# Patient Record
Sex: Female | Born: 1980 | Race: Black or African American | Hispanic: No | Marital: Single | Smoking: Never smoker
Health system: Southern US, Community
[De-identification: ages and names within clinical notes are randomized; demographics above are authoritative.]

## PROBLEM LIST (undated history)

## (undated) HISTORY — PX: LAPAROSCOPIC OVARIAN CYSTECTOMY: SUR786

## (undated) HISTORY — PX: KNEE SURGERY: SHX244

---

## 2002-07-07 ENCOUNTER — Inpatient Hospital Stay (HOSPITAL_COMMUNITY): Admission: AD | Admit: 2002-07-07 | Discharge: 2002-07-07 | Payer: Self-pay | Admitting: Obstetrics and Gynecology

## 2002-07-07 ENCOUNTER — Encounter: Payer: Self-pay | Admitting: Obstetrics and Gynecology

## 2002-09-21 ENCOUNTER — Inpatient Hospital Stay (HOSPITAL_COMMUNITY): Admission: AD | Admit: 2002-09-21 | Discharge: 2002-09-21 | Payer: Self-pay | Admitting: *Deleted

## 2002-09-21 ENCOUNTER — Encounter: Payer: Self-pay | Admitting: *Deleted

## 2002-09-24 ENCOUNTER — Encounter (HOSPITAL_COMMUNITY): Admission: RE | Admit: 2002-09-24 | Discharge: 2002-10-24 | Payer: Self-pay | Admitting: *Deleted

## 2002-09-28 ENCOUNTER — Encounter: Payer: Self-pay | Admitting: *Deleted

## 2002-10-03 ENCOUNTER — Inpatient Hospital Stay (HOSPITAL_COMMUNITY): Admission: AD | Admit: 2002-10-03 | Discharge: 2002-10-03 | Payer: Self-pay | Admitting: *Deleted

## 2003-10-30 ENCOUNTER — Emergency Department (HOSPITAL_COMMUNITY): Admission: EM | Admit: 2003-10-30 | Discharge: 2003-10-30 | Payer: Self-pay | Admitting: Emergency Medicine

## 2004-01-24 ENCOUNTER — Encounter (INDEPENDENT_AMBULATORY_CARE_PROVIDER_SITE_OTHER): Payer: Self-pay | Admitting: Specialist

## 2004-01-24 ENCOUNTER — Other Ambulatory Visit: Admission: RE | Admit: 2004-01-24 | Discharge: 2004-01-24 | Payer: Self-pay | Admitting: Obstetrics & Gynecology

## 2004-01-24 ENCOUNTER — Encounter: Admission: RE | Admit: 2004-01-24 | Discharge: 2004-01-24 | Payer: Self-pay | Admitting: Obstetrics and Gynecology

## 2004-03-13 ENCOUNTER — Other Ambulatory Visit: Admission: RE | Admit: 2004-03-13 | Discharge: 2004-03-13 | Payer: Self-pay | Admitting: Obstetrics & Gynecology

## 2004-03-13 ENCOUNTER — Encounter (INDEPENDENT_AMBULATORY_CARE_PROVIDER_SITE_OTHER): Payer: Self-pay | Admitting: *Deleted

## 2004-03-13 ENCOUNTER — Encounter: Admission: RE | Admit: 2004-03-13 | Discharge: 2004-03-13 | Payer: Self-pay | Admitting: Obstetrics and Gynecology

## 2004-03-19 ENCOUNTER — Emergency Department (HOSPITAL_COMMUNITY): Admission: EM | Admit: 2004-03-19 | Discharge: 2004-03-19 | Payer: Self-pay | Admitting: Emergency Medicine

## 2004-03-23 ENCOUNTER — Inpatient Hospital Stay (HOSPITAL_COMMUNITY): Admission: AD | Admit: 2004-03-23 | Discharge: 2004-03-23 | Payer: Self-pay | Admitting: Obstetrics and Gynecology

## 2004-03-24 ENCOUNTER — Inpatient Hospital Stay (HOSPITAL_COMMUNITY): Admission: AD | Admit: 2004-03-24 | Discharge: 2004-03-24 | Payer: Self-pay | Admitting: Obstetrics and Gynecology

## 2004-09-11 ENCOUNTER — Ambulatory Visit: Payer: Self-pay | Admitting: Obstetrics & Gynecology

## 2005-04-17 ENCOUNTER — Emergency Department (HOSPITAL_COMMUNITY): Admission: EM | Admit: 2005-04-17 | Discharge: 2005-04-17 | Payer: Self-pay | Admitting: Emergency Medicine

## 2006-11-04 ENCOUNTER — Emergency Department (HOSPITAL_COMMUNITY): Admission: EM | Admit: 2006-11-04 | Discharge: 2006-11-04 | Payer: Self-pay | Admitting: Emergency Medicine

## 2008-07-09 ENCOUNTER — Emergency Department (HOSPITAL_COMMUNITY): Admission: EM | Admit: 2008-07-09 | Discharge: 2008-07-09 | Payer: Self-pay | Admitting: Emergency Medicine

## 2009-01-09 ENCOUNTER — Emergency Department (HOSPITAL_COMMUNITY): Admission: EM | Admit: 2009-01-09 | Discharge: 2009-01-09 | Payer: Self-pay | Admitting: Emergency Medicine

## 2009-02-17 ENCOUNTER — Emergency Department (HOSPITAL_COMMUNITY): Admission: EM | Admit: 2009-02-17 | Discharge: 2009-02-17 | Payer: Self-pay | Admitting: Emergency Medicine

## 2009-09-03 ENCOUNTER — Emergency Department (HOSPITAL_COMMUNITY): Admission: EM | Admit: 2009-09-03 | Discharge: 2009-09-03 | Payer: Self-pay | Admitting: Emergency Medicine

## 2010-05-07 ENCOUNTER — Emergency Department (HOSPITAL_COMMUNITY): Admission: EM | Admit: 2010-05-07 | Discharge: 2010-05-08 | Payer: Self-pay | Admitting: Emergency Medicine

## 2010-12-21 LAB — URINALYSIS, ROUTINE W REFLEX MICROSCOPIC
Bilirubin Urine: NEGATIVE
Glucose, UA: NEGATIVE mg/dL
Hgb urine dipstick: NEGATIVE
Ketones, ur: NEGATIVE mg/dL
Nitrite: NEGATIVE
Protein, ur: NEGATIVE mg/dL
Specific Gravity, Urine: 1.026 (ref 1.005–1.030)
Urobilinogen, UA: 0.2 mg/dL (ref 0.0–1.0)
pH: 6.5 (ref 5.0–8.0)

## 2010-12-21 LAB — POCT PREGNANCY, URINE: Preg Test, Ur: NEGATIVE

## 2011-01-15 LAB — DIFFERENTIAL
Eosinophils Absolute: 0 10*3/uL (ref 0.0–0.7)
Lymphs Abs: 1 10*3/uL (ref 0.7–4.0)
Monocytes Relative: 10 % (ref 3–12)
Neutro Abs: 2 10*3/uL (ref 1.7–7.7)
Neutrophils Relative %: 59 % (ref 43–77)

## 2011-01-15 LAB — URINALYSIS, ROUTINE W REFLEX MICROSCOPIC
Bilirubin Urine: NEGATIVE
Ketones, ur: NEGATIVE mg/dL
Nitrite: NEGATIVE
Specific Gravity, Urine: 1.016 (ref 1.005–1.030)
Urobilinogen, UA: 1 mg/dL (ref 0.0–1.0)
pH: 7 (ref 5.0–8.0)

## 2011-01-15 LAB — COMPREHENSIVE METABOLIC PANEL
ALT: 17 U/L (ref 0–35)
BUN: 7 mg/dL (ref 6–23)
CO2: 26 mEq/L (ref 19–32)
Calcium: 9.4 mg/dL (ref 8.4–10.5)
Creatinine, Ser: 0.93 mg/dL (ref 0.4–1.2)
GFR calc non Af Amer: >60 mL/min (ref >60)
Glucose, Bld: 92 mg/dL (ref 70–99)
Total Protein: 7.4 g/dL (ref 6.0–8.3)

## 2011-01-15 LAB — PREGNANCY, URINE: Preg Test, Ur: NEGATIVE

## 2011-01-15 LAB — CBC
HCT: 36.8 % (ref 36.0–46.0)
Hemoglobin: 12.6 g/dL (ref 12.0–15.0)
MCHC: 34.1 g/dL (ref 30.0–36.0)
MCV: 94.3 fL (ref 78.0–100.0)
RBC: 3.91 MIL/uL (ref 3.87–5.11)
RDW: 13.7 % (ref 11.5–15.5)

## 2011-01-15 LAB — LIPASE, BLOOD: Lipase: 16 U/L (ref 11–59)

## 2012-06-14 ENCOUNTER — Encounter (HOSPITAL_COMMUNITY): Payer: Self-pay | Admitting: Emergency Medicine

## 2012-06-14 ENCOUNTER — Emergency Department (HOSPITAL_COMMUNITY)
Admission: EM | Admit: 2012-06-14 | Discharge: 2012-06-14 | Disposition: A | Discharge status: Home / Self Care | Payer: Self-pay | Attending: Emergency Medicine | Admitting: Emergency Medicine

## 2012-06-14 ENCOUNTER — Emergency Department (HOSPITAL_COMMUNITY): Payer: Self-pay

## 2012-06-14 DIAGNOSIS — B349 Viral infection, unspecified: Secondary | ICD-10-CM

## 2012-06-14 DIAGNOSIS — B9789 Other viral agents as the cause of diseases classified elsewhere: Secondary | ICD-10-CM | POA: Insufficient documentation

## 2012-06-14 DIAGNOSIS — R0982 Postnasal drip: Secondary | ICD-10-CM

## 2012-06-14 MED ORDER — OXYMETAZOLINE HCL 0.05 % NA SOLN: 2.0000 | Freq: Two times a day (BID) | NASAL | Status: AC

## 2012-06-14 MED ORDER — PSEUDOEPHEDRINE HCL 30 MG/5ML PO SYRP: 30.0000 mg | ORAL_SOLUTION | Freq: Four times a day (QID) | ORAL | Status: AC | PRN

## 2012-06-14 NOTE — ED Notes (Signed)
States that she started having a bad cough last pm accompanied by throat pain and right ear pain. States that she has a productive cough of greenish brown mucus. States that she also has head congestion but is unable to clear her sinuses.

## 2012-06-14 NOTE — ED Notes (Signed)
Discharge instructions reviewed w/ pt., verbalizes understanding. Two prescriptions provided at time of discharge. 

## 2012-06-14 NOTE — ED Provider Notes (Signed)
History     CSN: [id number]  Arrival date & time 06/14/12  0910   First MD Initiated Contact with Patient 06/14/12 [phone]      Chief Complaint  Patient presents with  . Otalgia  . Sore Throat  . Pleurisy    (Consider location/radiation/quality/duration/timing/severity/associated sxs/prior treatment) The history is provided by the patient.  Rebecca Mccarthy is a 31 y.o. female here with sinus congestion, cough, and throat pain, ear pain, and chest pain. Started with sinus congestion x 1 week. She notes that she has been having significant post nasal drip and now is coughing up greenish phlegm. No fevers at home. She also feels that her throat and ears hurt as well. She also felt tightness in her chest when she coughs. She has been using dayquil and that hasn't helped. She is not a smoker and has no family hx of CAD.   History reviewed. No pertinent past medical history.  Past Surgical History  Procedure Date  . Cesarean section   . Knee surgery   . Laparoscopic ovarian cystectomy     No family history on file.  History  Substance Use Topics  . Smoking status: Never Smoker   . Smokeless tobacco: Not on file  . Alcohol Use: No    OB History    Grav Para Term Preterm Abortions TAB SAB Ect Mult Living                  Review of Systems  HENT: Positive for ear pain, congestion, sore throat, rhinorrhea, postnasal drip and sinus pressure.   Cardiovascular: Positive for chest pain.  All other systems reviewed and are negative.    Allergies  Shrimp  Home Medications   Current Outpatient Rx  Name Route Sig Dispense Refill  . DAYQUIL MULTI-SYMPTOM COLD/FLU PO Oral Take 1 capsule by mouth daily as needed. For cold      BP 118/68  Pulse 66  Temp 98.1 F (36.7 C) (Oral)  Resp 21  Wt 235 lb (106.595 kg)  SpO2 95%  Physical Exam  Nursing note and vitals reviewed. Constitutional: She is oriented to person, place, and time. She appears well-developed and  well-nourished.       NAD  HENT:  Head: Normocephalic.  Mouth/Throat: Oropharynx is clear and moist.       Bilateral TM no effusion. No sinus tenderness. OP clear.   Eyes: Conjunctivae are normal. Pupils are equal, round, and reactive to light.  Neck: Normal range of motion. Neck supple.  Cardiovascular: Normal rate, regular rhythm and normal heart sounds.   Pulmonary/Chest: Effort normal and breath sounds normal. No respiratory distress. She has no wheezes.  Abdominal: Soft. Bowel sounds are normal.  Musculoskeletal: Normal range of motion. She exhibits no edema and no tenderness.  Neurological: She is alert and oriented to person, place, and time.  Skin: Skin is warm and dry.  Psychiatric: She has a normal mood and affect. Her behavior is normal. Judgment and thought content normal.    ED Course  Procedures (including critical care time)  Labs Reviewed - No data to display Dg Chest 2 View  06/14/2012  *RADIOLOGY REPORT*  Clinical Data: Sore throat and drainage 6 days with cough.  CHEST - 2 VIEW  Comparison: None.  Findings: Normal heart, mediastinal, and hilar contours.  Lungs are normally expanded and clear.  The trachea is midline.  The bony structures and upper abdomen are unremarkable.  IMPRESSION: No acute cardiopulmonary disease.   Original  Report Authenticated By: Britta Mccreedy, M.D.      No diagnosis found.   Date: 06/14/2012  Rate: 66  Rhythm: normal sinus rhythm  QRS Axis: normal  Intervals: normal  ST/T Wave abnormalities: normal  Conduction Disutrbances:none  Narrative Interpretation:   Old EKG Reviewed: none available    MDM  [name] is a 31 y.o. female here with viral syndrome. Her symptoms are likely from post nasal drip. I counseled her to continue pseudoephed, use afrin for congestion x 3 days, and take motrin for pain. Her EKG is nl and I have low suspicion for acs on her.          Richardean Canal, MD 06/14/12 1041

## 2013-03-30 ENCOUNTER — Emergency Department (HOSPITAL_COMMUNITY): Payer: PRIVATE HEALTH INSURANCE

## 2013-03-30 ENCOUNTER — Emergency Department (HOSPITAL_COMMUNITY)
Admission: EM | Admit: 2013-03-30 | Discharge: 2013-03-30 | Disposition: A | Discharge status: Home / Self Care | Payer: PRIVATE HEALTH INSURANCE | Attending: Emergency Medicine | Admitting: Emergency Medicine

## 2013-03-30 ENCOUNTER — Encounter (HOSPITAL_COMMUNITY): Payer: Self-pay

## 2013-03-30 DIAGNOSIS — S46819A Strain of other muscles, fascia and tendons at shoulder and upper arm level, unspecified arm, initial encounter: Secondary | ICD-10-CM | POA: Insufficient documentation

## 2013-03-30 DIAGNOSIS — S4991XA Unspecified injury of right shoulder and upper arm, initial encounter: Secondary | ICD-10-CM

## 2013-03-30 DIAGNOSIS — Z79899 Other long term (current) drug therapy: Secondary | ICD-10-CM | POA: Insufficient documentation

## 2013-03-30 DIAGNOSIS — M62838 Other muscle spasm: Secondary | ICD-10-CM

## 2013-03-30 DIAGNOSIS — S46811A Strain of other muscles, fascia and tendons at shoulder and upper arm level, right arm, initial encounter: Secondary | ICD-10-CM

## 2013-03-30 DIAGNOSIS — Z791 Long term (current) use of non-steroidal anti-inflammatories (NSAID): Secondary | ICD-10-CM | POA: Insufficient documentation

## 2013-03-30 DIAGNOSIS — S43499A Other sprain of unspecified shoulder joint, initial encounter: Secondary | ICD-10-CM | POA: Insufficient documentation

## 2013-03-30 MED ORDER — OXYCODONE-ACETAMINOPHEN 5-325 MG PO TABS
1.0000 | ORAL_TABLET | Freq: Once | ORAL | Status: AC
Start: 2013-03-30 — End: 2013-03-30
  Administered 2013-03-30: 1 via ORAL
  Filled 2013-03-30: qty 1

## 2013-03-30 MED ORDER — METHOCARBAMOL 500 MG PO TABS: 500.0000 mg | ORAL_TABLET | Freq: Two times a day (BID) | ORAL | Status: DC

## 2013-03-30 MED ORDER — HYDROCODONE-ACETAMINOPHEN 5-325 MG PO TABS: 1.0000 | ORAL_TABLET | ORAL | Status: DC | PRN

## 2013-03-30 MED ORDER — NAPROXEN 500 MG PO TABS: 500.0000 mg | ORAL_TABLET | Freq: Two times a day (BID) | ORAL | Status: DC

## 2013-03-30 MED ORDER — METHOCARBAMOL 500 MG PO TABS
500.0000 mg | ORAL_TABLET | Freq: Once | ORAL | Status: AC
  Administered 2013-03-30: 500 mg via ORAL
  Filled 2013-03-30: qty 1

## 2013-03-30 NOTE — ED Notes (Signed)
PT ambulated with baseline gait; VSS; A&Ox3; no signs of distress; respirations even and unlabored; skin warm and dry; no questions upon discharge.  

## 2013-03-30 NOTE — Progress Notes (Signed)
Orthopedic Tech Progress Note Patient Details:  Rebecca Mccarthy 12/02/1980 [id number]  Ortho Devices Type of Ortho Device: Arm sling Ortho Device/Splint Location: RUE Ortho Device/Splint Interventions: Ordered;Application   Jennye Moccasin 03/30/2013, 9:52 PM
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
## 2013-03-31 NOTE — ED Provider Notes (Signed)
Medical screening examination/treatment/procedure(s) were performed by non-physician practitioner and as supervising physician I was immediately available for consultation/collaboration.   Carleene Cooper III, MD 03/31/13 [phone]

## 2014-01-04 ENCOUNTER — Emergency Department (HOSPITAL_COMMUNITY)
Admission: EM | Admit: 2014-01-04 | Discharge: 2014-01-04 | Disposition: A | Discharge status: Home / Self Care | Payer: PRIVATE HEALTH INSURANCE | Attending: Emergency Medicine | Admitting: Emergency Medicine

## 2014-01-04 ENCOUNTER — Emergency Department (HOSPITAL_COMMUNITY): Payer: PRIVATE HEALTH INSURANCE

## 2014-01-04 ENCOUNTER — Encounter (HOSPITAL_COMMUNITY): Payer: Self-pay | Admitting: Emergency Medicine

## 2014-01-04 DIAGNOSIS — R05 Cough: Secondary | ICD-10-CM | POA: Insufficient documentation

## 2014-01-04 DIAGNOSIS — R0781 Pleurodynia: Secondary | ICD-10-CM

## 2014-01-04 DIAGNOSIS — R071 Chest pain on breathing: Secondary | ICD-10-CM | POA: Insufficient documentation

## 2014-01-04 DIAGNOSIS — R059 Cough, unspecified: Secondary | ICD-10-CM | POA: Insufficient documentation

## 2014-01-04 MED ORDER — NAPROXEN 500 MG PO TABS: 500.0000 mg | ORAL_TABLET | Freq: Two times a day (BID) | ORAL | Status: DC

## 2014-01-04 MED ORDER — KETOROLAC TROMETHAMINE 60 MG/2ML IM SOLN
60.0000 mg | Freq: Once | INTRAMUSCULAR | Status: AC
  Administered 2014-01-04: 60 mg via INTRAMUSCULAR
  Filled 2014-01-04: qty 2

## 2014-01-04 MED ORDER — TRAMADOL HCL 50 MG PO TABS: 50.0000 mg | ORAL_TABLET | Freq: Four times a day (QID) | ORAL | Status: DC | PRN

## 2014-01-04 NOTE — Discharge Instructions (Signed)
Pleurisy Pleurisy is an inflammation and swelling of the lining of the lungs (pleura). Because of this inflammation, it hurts to breathe. It can be aggravated by coughing, laughing, or deep breathing. Pleurisy is often caused by an underlying infection or disease.  HOME CARE INSTRUCTIONS  Monitor your pleurisy for any changes. The following actions may help to alleviate any discomfort you are experiencing:  Medicine may help with pain. Only take over-the-counter or prescription medicines for pain, discomfort, or fever as directed by your health care provider.  Only take antibiotic medicine as directed. Make sure to finish it even if you start to feel better. SEEK MEDICAL CARE IF:   Your pain is not controlled with medicine or is increasing.  You have an increase in pus-like (purulent) secretions brought up with coughing. SEEK IMMEDIATE MEDICAL CARE IF:   You have blue or dark lips, fingernails, or toenails.  You are coughing up blood.  You have increased difficulty breathing.  You have continuing pain unrelieved by medicine or pain lasting more than 1 week.  You have pain that radiates into your neck, arms, or jaw.  You develop increased shortness of breath or wheezing.  You develop a fever, rash, vomiting, fainting, or other serious symptoms. MAKE SURE YOU:  Understand these instructions.   Will watch your condition.   Will get help right away if you are not doing well or get worse.  Document Released: 09/23/2005 Document Revised: 05/26/2013 Document Reviewed: 03/07/2013 [facility] Patient Information 2014 [employer], [place].  Naproxen and naproxen sodium oral immediate-release tablets What is this medicine? NAPROXEN (na PROX en) is a non-steroidal anti-inflammatory drug (NSAID). It is used to reduce swelling and to treat pain. This medicine may be used for dental pain, headache, or painful monthly periods. It is also used for painful joint and muscular problems such as  arthritis, tendinitis, bursitis, and gout. This medicine may be used for other purposes; ask your health care provider or pharmacist if you have questions. COMMON BRAND NAME(S): Aflaxen, Aleve Arthritis, Aleve, All Day Relief, Anaprox DS, Anaprox, Naprosyn What should I tell my health care provider before I take this medicine? They need to know if you have any of these conditions: -asthma -cigarette smoker -drink more than 3 alcohol containing drinks a day -heart disease or circulation problems such as heart failure or leg edema (fluid retention) -high blood pressure -kidney disease -liver disease -stomach bleeding or ulcers -an unusual or allergic reaction to naproxen, aspirin, other NSAIDs, other medicines, foods, dyes, or preservatives -pregnant or trying to get pregnant -breast-feeding How should I use this medicine? Take this medicine by mouth with a glass of water. Follow the directions on the prescription label. Take it with food if your stomach gets upset. Try to not lie down for at least 10 minutes after you take it. Take your medicine at regular intervals. Do not take your medicine more often than directed. Long-term, continuous use may increase the risk of heart attack or stroke. A special MedGuide will be given to you by the pharmacist with each prescription and refill. Be sure to read this information carefully each time. Talk to your pediatrician regarding the use of this medicine in children. Special care may be needed. Overdosage: If you think you have taken too much of this medicine contact a poison control center or emergency room at once. NOTE: This medicine is only for you. Do not share this medicine with others. What if I miss a dose? If you miss a dose,  take it as soon as you can. If it is almost time for your next dose, take only that dose. Do not take double or extra doses. What may interact with this  medicine? -alcohol -aspirin -cidofovir -diuretics -lithium -methotrexate -other drugs for inflammation like ketorolac or prednisone -pemetrexed -probenecid -warfarin This list may not describe all possible interactions. Give your health care provider a list of all the medicines, herbs, non-prescription drugs, or dietary supplements you use. Also tell them if you smoke, drink alcohol, or use illegal drugs. Some items may interact with your medicine. What should I watch for while using this medicine? Tell your doctor or health care professional if your pain does not get better. Talk to your doctor before taking another medicine for pain. Do not treat yourself. This medicine does not prevent heart attack or stroke. In fact, this medicine may increase the chance of a heart attack or stroke. The chance may increase with longer use of this medicine and in people who have heart disease. If you take aspirin to prevent heart attack or stroke, talk with your doctor or health care professional. Do not take other medicines that contain aspirin, ibuprofen, or naproxen with this medicine. Side effects such as stomach upset, nausea, or ulcers may be more likely to occur. Many medicines available without a prescription should not be taken with this medicine. This medicine can cause ulcers and bleeding in the stomach and intestines at any time during treatment. Do not smoke cigarettes or drink alcohol. These increase irritation to your stomach and can make it more susceptible to damage from this medicine. Ulcers and bleeding can happen without warning symptoms and can cause death. You may get drowsy or dizzy. Do not drive, use machinery, or do anything that needs mental alertness until you know how this medicine affects you. Do not stand or sit up quickly, especially if you are an older patient. This reduces the risk of dizzy or fainting spells. This medicine can cause you to bleed more easily. Try to avoid damage  to your teeth and gums when you brush or floss your teeth. What side effects may I notice from receiving this medicine? Side effects that you should report to your doctor or health care professional as soon as possible: -black or bloody stools, blood in the urine or vomit -blurred vision -chest pain -difficulty breathing or wheezing -nausea or vomiting -severe stomach pain -skin rash, skin redness, blistering or peeling skin, hives, or itching -slurred speech or weakness on one side of the body -swelling of eyelids, throat, lips -unexplained weight gain or swelling -unusually weak or tired -yellowing of eyes or skin Side effects that usually do not require medical attention (report to your doctor or health care professional if they continue or are bothersome): -constipation -headache -heartburn This list may not describe all possible side effects. Call your doctor for medical advice about side effects. You may report side effects to FDA at 1-800-FDA-1088. Where should I keep my medicine? Keep out of the reach of children. Store at room temperature between 15 and 30 degrees C (59 and 86 degrees F). Keep container tightly closed. Throw away any unused medicine after the expiration date. NOTE: This sheet is a summary. It may not cover all possible information. If you have questions about this medicine, talk to your doctor, pharmacist, or health care provider.  2014, Elsevier/Gold Standard. (2009-09-25 20:10:16)  Tramadol tablets What is this medicine? TRAMADOL (TRA ma dole) is a pain reliever. It is used  to treat moderate to severe pain in adults. This medicine may be used for other purposes; ask your health care provider or pharmacist if you have questions. COMMON BRAND NAME(S): Ultram What should I tell my health care provider before I take this medicine? They need to know if you have any of these conditions: -brain tumor -depression -drug abuse or addiction -head injury -if you  frequently drink alcohol containing drinks -kidney disease or trouble passing urine -liver disease -lung disease, asthma, or breathing problems -seizures or epilepsy -suicidal thoughts, plans, or attempt; a previous suicide attempt by you or a family member -an unusual or allergic reaction to tramadol, codeine, other medicines, foods, dyes, or preservatives -pregnant or trying to get pregnant -breast-feeding How should I use this medicine? Take this medicine by mouth with a full glass of water. Follow the directions on the prescription label. If the medicine upsets your stomach, take it with food or milk. Do not take more medicine than you are told to take. Talk to your pediatrician regarding the use of this medicine in children. Special care may be needed. Overdosage: If you think you have taken too much of this medicine contact a poison control center or emergency room at once. NOTE: This medicine is only for you. Do not share this medicine with others. What if I miss a dose? If you miss a dose, take it as soon as you can. If it is almost time for your next dose, take only that dose. Do not take double or extra doses. What may interact with this medicine? Do not take this medicine with any of the following medications: -MAOIs like Carbex, Eldepryl, Marplan, Nardil, and Parnate This medicine may also interact with the following medications: -alcohol or medicines that contain alcohol -antihistamines -benzodiazepines -bupropion -carbamazepine or oxcarbazepine -clozapine -cyclobenzaprine -digoxin -furazolidone -linezolid -medicines for depression, anxiety, or psychotic disturbances -medicines for migraine headache like almotriptan, eletriptan, frovatriptan, naratriptan, rizatriptan, sumatriptan, zolmitriptan -medicines for pain like pentazocine, buprenorphine, butorphanol, meperidine, nalbuphine, and propoxyphene -medicines for sleep -muscle  relaxants -naltrexone -phenobarbital -phenothiazines like perphenazine, thioridazine, chlorpromazine, mesoridazine, fluphenazine, prochlorperazine, promazine, and trifluoperazine -procarbazine -warfarin This list may not describe all possible interactions. Give your health care provider a list of all the medicines, herbs, non-prescription drugs, or dietary supplements you use. Also tell them if you smoke, drink alcohol, or use illegal drugs. Some items may interact with your medicine. What should I watch for while using this medicine? Tell your doctor or health care professional if your pain does not go away, if it gets worse, or if you have new or a different type of pain. You may develop tolerance to the medicine. Tolerance means that you will need a higher dose of the medicine for pain relief. Tolerance is normal and is expected if you take this medicine for a long time. Do not suddenly stop taking your medicine because you may develop a severe reaction. Your body becomes used to the medicine. This does NOT mean you are addicted. Addiction is a behavior related to getting and using a drug for a non-medical reason. If you have pain, you have a medical reason to take pain medicine. Your doctor will tell you how much medicine to take. If your doctor wants you to stop the medicine, the dose will be slowly lowered over time to avoid any side effects. You may get drowsy or dizzy. Do not drive, use machinery, or do anything that needs mental alertness until you know how this medicine affects you.  Do not stand or sit up quickly, especially if you are an older patient. This reduces the risk of dizzy or fainting spells. Alcohol can increase or decrease the effects of this medicine. Avoid alcoholic drinks. You may have constipation. Try to have a bowel movement at least every 2 to 3 days. If you do not have a bowel movement for 3 days, call your doctor or health care professional. Your mouth may get dry. Chewing  sugarless gum or sucking hard candy, and drinking plenty of water may help. Contact your doctor if the problem does not go away or is severe. What side effects may I notice from receiving this medicine? Side effects that you should report to your doctor or health care professional as soon as possible: -allergic reactions like skin rash, itching or hives, swelling of the face, lips, or tongue -breathing difficulties, wheezing -confusion -itching -light headedness or fainting spells -redness, blistering, peeling or loosening of the skin, including inside the mouth -seizures Side effects that usually do not require medical attention (report to your doctor or health care professional if they continue or are bothersome): -constipation -dizziness -drowsiness -headache -nausea, vomiting This list may not describe all possible side effects. Call your doctor for medical advice about side effects. You may report side effects to FDA at 1-800-FDA-1088. Where should I keep my medicine? Keep out of the reach of children. Store at room temperature between 15 and 30 degrees C (59 and 86 degrees F). Keep container tightly closed. Throw away any unused medicine after the expiration date. NOTE: This sheet is a summary. It may not cover all possible information. If you have questions about this medicine, talk to your doctor, pharmacist, or health care provider.  2014, Elsevier/Gold Standard. (2010-06-06 11:55:44)

## 2014-01-04 NOTE — ED Notes (Addendum)
Presents with chest heaviness that began after after getting up from a lying position yesterday, pain is wrose with sneezing, coughing and deep breathing. Pt has a stuffy nose and has been coughing frequently but is unable now due to pain. Bilateral breath sounds clear.  Pain is generalized to entire upper chest.

## 2014-01-04 NOTE — ED Notes (Signed)
Patient transported to X-ray 

## 2014-01-04 NOTE — ED Provider Notes (Signed)
CSN: [id number]     Arrival date & time 01/04/14  0125 History   First MD Initiated Contact with Patient 01/04/14 0411     Chief Complaint  Patient presents with  . Chest Pain     (Consider location/radiation/quality/duration/timing/severity/associated sxs/prior Treatment) Patient is a 33 y.o. female presenting with chest pain. The history is provided by the patient.  Chest Pain She complains of anterior chest pain when she coughs sneezes or takes a deep breath. This started earlier today. Cough is nonproductive. She denies fever chills or sweats. She is not truly dyspneic but it hurts to take a deep breath. She denies arthralgias or myalgias. She's not taken anything for pain. She is a nonsmoker, but is exposed to secondhand smoke. She rates pain at 6/10 when she sneezes and then will gradually subside away completely. She is complaining of inability to breathe through her nose and states that she has to breathe through her mouth and wonders if that is causing some of the problems.  History reviewed. No pertinent past medical history. Past Surgical History  Procedure Laterality Date  . Cesarean section    . Knee surgery    . Laparoscopic ovarian cystectomy     History reviewed. No pertinent family history. History  Substance Use Topics  . Smoking status: Never Smoker   . Smokeless tobacco: Not on file  . Alcohol Use: No   OB History   Grav Para Term Preterm Abortions TAB SAB Ect Mult Living                 Review of Systems  Cardiovascular: Positive for chest pain.  All other systems reviewed and are negative.      Allergies  Aspirin and Shrimp  Home Medications   Current Outpatient Rx  Name  Route  Sig  Dispense  Refill  . medroxyPROGESTERone (DEPO-PROVERA) 150 MG/ML injection   Intramuscular   Inject 150 mg into the muscle every 3 (three) months.          BP 132/88  Pulse 88  Temp(Src) 97.9 F (36.6 C) (Oral)  Resp 18  SpO2 100% Physical Exam  Nursing  note and vitals reviewed.  33 year old female, resting comfortably and in no acute distress. Vital signs are normal. Oxygen saturation is 100%, which is normal. Head is normocephalic and atraumatic. PERRLA, EOMI. Oropharynx is clear. Left turbinates are mildly edematous. There is no nasal drainage and there is no sinus tenderness. Neck is nontender and supple without adenopathy or JVD. Back is nontender and there is no CVA tenderness. Lungs are clear without rales, wheezes, or rhonchi. Chest is nontender. Heart has regular rate and rhythm without murmur. Abdomen is soft, flat, nontender without masses or hepatosplenomegaly and peristalsis is normoactive. Extremities have no cyanosis or edema, full range of motion is present. Skin is warm and dry without rash. Neurologic: Mental status is normal, cranial nerves are intact, there are no motor or sensory deficits.  ED Course  Procedures (including critical care time) Imaging Review Dg Chest 2 View  01/04/2014   CLINICAL DATA:  Chest pain, shortness of breath.  EXAM: CHEST  2 VIEW  COMPARISON:  DG CHEST 2 VIEW dated 06/14/2012  FINDINGS: Cardiomediastinal silhouette is unremarkable. The lungs are clear without pleural effusions or focal consolidations. Trachea projects midline and there is no pneumothorax. Soft tissue planes and included osseous structures are non-suspicious. Multiple EKG lines overlie the patient and may obscure subtle underlying pathology.  IMPRESSION: No active cardiopulmonary  disease.   Electronically Signed   By: Awilda Metro   On: 01/04/2014 04:43     EKG Interpretation   Date/Time:  Tuesday January 04 2014 01:30:46 EDT Ventricular Rate:  84 PR Interval:  142 QRS Duration: 74 QT Interval:  364 QTC Calculation: 430 R Axis:   -12 Text Interpretation:  Normal sinus rhythm Normal ECG When compared with  ECG of 06/14/2012, No significant change was found Confirmed by [facility]  MD,  Taviana Westergren ([id number]) on 01/04/2014 1:35:49 AM       MDM   Final diagnoses:  Pleuritic chest pain    Pleuritic chest pain which is probably secondary to viral pleurisy. No sign of more serious pathology. Chest x-ray or be obtained to rule out pneumonia and she'll be given a therapeutic trial of ketorolac. It is noted that she has an aspirin allergy but states that she has been noted take ibuprofen without any difficulty.  She got moderate relief of pain with ketorolac. Chest x-ray shows no evidence of pneumonia. She is discharged with prescriptions for naproxen and tramadol.  Dione Booze, MD 01/04/14 [phone]

## 2014-12-09 ENCOUNTER — Emergency Department (HOSPITAL_COMMUNITY)
Admission: EM | Admit: 2014-12-09 | Discharge: 2014-12-10 | Disposition: A | Discharge status: Home / Self Care | Payer: No Typology Code available for payment source | Attending: Emergency Medicine | Admitting: Emergency Medicine

## 2014-12-09 ENCOUNTER — Encounter (HOSPITAL_COMMUNITY): Payer: Self-pay

## 2014-12-09 DIAGNOSIS — R51 Headache: Secondary | ICD-10-CM

## 2014-12-09 DIAGNOSIS — Y92410 Unspecified street and highway as the place of occurrence of the external cause: Secondary | ICD-10-CM | POA: Diagnosis not present

## 2014-12-09 DIAGNOSIS — S0990XA Unspecified injury of head, initial encounter: Secondary | ICD-10-CM | POA: Insufficient documentation

## 2014-12-09 DIAGNOSIS — R519 Headache, unspecified: Secondary | ICD-10-CM

## 2014-12-09 DIAGNOSIS — Y998 Other external cause status: Secondary | ICD-10-CM | POA: Insufficient documentation

## 2014-12-09 DIAGNOSIS — S161XXA Strain of muscle, fascia and tendon at neck level, initial encounter: Secondary | ICD-10-CM | POA: Diagnosis not present

## 2014-12-09 DIAGNOSIS — Y9389 Activity, other specified: Secondary | ICD-10-CM | POA: Diagnosis not present

## 2014-12-09 DIAGNOSIS — Z791 Long term (current) use of non-steroidal anti-inflammatories (NSAID): Secondary | ICD-10-CM | POA: Insufficient documentation
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
## 2014-12-10 MED ORDER — ACETAMINOPHEN 325 MG PO TABS: 325.0000 mg | ORAL_TABLET | Freq: Four times a day (QID) | ORAL | Status: DC | PRN

## 2014-12-10 MED ORDER — ACETAMINOPHEN 325 MG PO TABS
325.0000 mg | ORAL_TABLET | Freq: Once | ORAL | Status: AC
  Administered 2014-12-10: 325 mg via ORAL
  Filled 2014-12-10: qty 1

## 2014-12-10 NOTE — Discharge Instructions (Signed)
Please call your doctor for a followup appointment within 24-48 hours. When you talk to your doctor please let them know that you were seen in the emergency department and have them acquire all of your records so that they can discuss the findings with you and formulate a treatment plan to fully care for your new and ongoing problems. Please follow-up with health and wellness Center Please follow-up with orthopedics Please rest and stay hydrated Please take Tylenol as prescribed and on a full stomach-please no more than 2400 mg per day. Please drink plenty of water Please continue to monitor symptoms closely and if symptoms are to worsen or change (fever greater than 101, chills, sweating, nausea, vomiting, chest pain, shortness of breathe, difficulty breathing, weakness, numbness, tingling, worsening or changes to pain pattern, visual changes, neck stiffness, inability to swallow, loss sensation, fainting) please report back to the Emergency Department immediately.   General Headache Without Cause A headache is pain or discomfort felt around the head or neck area. The specific cause of a headache may not be found. There are many causes and types of headaches. A few common ones are:  Tension headaches.  Migraine headaches.  Cluster headaches.  Chronic daily headaches. HOME CARE INSTRUCTIONS   Keep all follow-up appointments with your caregiver or any specialist referral.  Only take over-the-counter or prescription medicines for pain or discomfort as directed by your caregiver.  Lie down in a dark, quiet room when you have a headache.  Keep a headache journal to find out what may trigger your migraine headaches. For example, write down:  What you eat and drink.  How much sleep you get.  Any change to your diet or medicines.  Try massage or other relaxation techniques.  Put ice packs or heat on the head and neck. Use these 3 to 4 times per day for 15 to 20 minutes each time, or as  needed.  Limit stress.  Sit up straight, and do not tense your muscles.  Quit smoking if you smoke.  Limit alcohol use.  Decrease the amount of caffeine you drink, or stop drinking caffeine.  Eat and sleep on a regular schedule.  Get 7 to 9 hours of sleep, or as recommended by your caregiver.  Keep lights dim if bright lights bother you and make your headaches worse. SEEK MEDICAL CARE IF:   You have problems with the medicines you were prescribed.  Your medicines are not working.  You have a change from the usual headache.  You have nausea or vomiting. SEEK IMMEDIATE MEDICAL CARE IF:   Your headache becomes severe.  You have a fever.  You have a stiff neck.  You have loss of vision.  You have muscular weakness or loss of muscle control.  You start losing your balance or have trouble walking.  You feel faint or pass out.  You have severe symptoms that are different from your first symptoms. MAKE SURE YOU:   Understand these instructions.  Will watch your condition.  Will get help right away if you are not doing well or get worse. Document Released: 09/23/2005 Document Revised: 12/16/2011 Document Reviewed: 10/09/2011 [facility] Patient Information 2015 [place], [place]. This information is not intended to replace advice given to you by your health care provider. Make sure you discuss any questions you have with your health care provider.   Emergency Department Resource Guide 1) Find a Doctor and Pay Out of Pocket Although you won't have to find out who is covered  by your insurance plan, it is a good idea to ask around and get recommendations. You will then need to call the office and see if the doctor you have chosen will accept you as a new patient and what types of options they offer for patients who are self-pay. Some doctors offer discounts or will set up payment plans for their patients who do not have insurance, but you will need to ask so you aren't  surprised when you get to your appointment.  2) Contact Your Local Health Department Not all health departments have doctors that can see patients for sick visits, but many do, so it is worth a call to see if yours does. If you don't know where your local health department is, you can check in your phone book. The CDC also has a tool to help you locate your state's health department, and many state websites also have listings of all of their local health departments.  3) Find a Walk-in Clinic If your illness is not likely to be very severe or complicated, you may want to try a walk in clinic. These are popping up all over the country in pharmacies, drugstores, and shopping centers. They're usually staffed by nurse practitioners or physician assistants that have been trained to treat common illnesses and complaints. They're usually fairly quick and inexpensive. However, if you have serious medical issues or chronic medical problems, these are probably not your best option.  No Primary Care Doctor: - Call Health Connect at  [phone] - they can help you locate a primary care doctor that  accepts your insurance, provides certain services, etc. - Physician Referral Service- [phone]  Chronic Pain Problems: Organization         Address  Phone   Notes  [facility]  [phone] Patients need to be referred by their primary care doctor.   Medication Assistance: Organization         Address  Phone   Notes  [facility] Medication [facility] [address] [place], [place] [address] [phone] --Must be a resident of [facility] -- Must have NO insurance coverage whatsoever (no Medicaid/ Medicare, etc.) -- The pt. MUST have a primary care doctor that directs their care regularly and follows them in the community   MedAssist  [phone]   [employer]  [phone]    Agencies that provide inexpensive medical care: Organization          Address  Phone   Notes  [facility]  [phone]   [facility]    [phone]   [facility] [address] [place], [place] [address] [phone]   [facility] [address] [place], [place] [phone]   Planned Parenthood    [phone]   [facility]    [phone]   [facility]  [address], [place] Phone:  [phone], Fax:  [phone] Hours of Operation:  9 am - 6 pm, M-F.  Also accepts Medicaid/Medicare and self-pay.  [facility]  [address], [place] Phone: [phone], Fax: [phone]. Hours of Operation:  8:30 am - 5:30 pm, M-F.  Also accepts Medicaid and self-pay.  [facility] [place] [address], [employer] Phone: [phone]   [facility] [address] [place], [place] [phone], Ext. [phone]  Mondays & Thursdays: 7-9 AM.  First 15 patients are seen on a first come, first serve basis.    Medicaid-accepting [facility] Providers:  Organization         Address  Phone   Notes  [facility] [address], [place] [phone]) [phone] Also accepts self-pay patients.  [facility] [address], [place]  [phone]) [phone]   [facility] [address], [place] [phone]) [phone]   [facility] [address] [place] [address], [place] [phone]) [phone]   Renaye RakersVeita Bland [address], [place]   [phone]) [phone] Only accepts [place] Access [place] patients after they have their name applied to their card.   Self-Pay (no insurance) in [facility]:  Organization         Address  Phone   Notes  Sickle Cell Patients, [facility] [address], [place] [phone]) [phone]   [facility] [address], [place] [phone]) [phone]     [facility] [place]  [address], [place] [phone]) [phone]   [facility]/Dr. Osei-Bonsu  [address] [place] [address], [place] or [phone], [place] [phone]) [phone] Phone number for both [place] and [place] locations is the same.  [facility] and [facility] [address], [place] [phone]) [phone]   [facility] [place] [address] [place] [address], [place] or [address] [phone]   [facility] [address], [place] [phone](336) [phone], phone; [phone](336) [phone], fax Sees patients 1st and 3rd Saturday of every month.  Must not qualify for public or private insurance (i.e. Medicaid, Medicare, [place] Health Choice, Veterans' Benefits)  Household income should be no more than 200% of the poverty level The clinic cannot treat you if you are pregnant or think you are pregnant  Sexually transmitted diseases are not treated at the clinic.    Dental Care: Organization         Address  Phone  Notes  [facility] Department of [facility] [address], [place] [phone] Accepts children up to age 921 who are enrolled in [place]icaid or [place] Health Choice; pregnant women with a Medicaid card; and children who have applied for Medicaid or [place] Health Choice, but were declined, whose parents can pay a reduced fee at time of service.  [facility] [place]  [address], [place] [phone]) [phone] Accepts children up to age 35 who are enrolled in [place] or [place] Health Choice; pregnant women with a Medicaid card; and children who have applied for Medicaid or [place] Health Choice, but were declined, whose parents can pay a reduced fee at time of service.  [facility]  [address], [place] [phone]) [phone] Patients are seen by appointment only. Walk-ins are not accepted. [facility] will see patients 3218  years of age and older. Monday - Tuesday (8am-5pm) Most Wednesdays (8:30-5pm) $30 per visit, cash only  [facility]  [address], [facility] [phone]) [phone] Patients are seen by appointment only. Walk-ins are not accepted. [facility] will see patients 35 years of age and older. One Wednesday Evening (Monthly: Volunteer Based).  $30 per visit, cash only  [employer] [facility] [facility]  [phone]) [phone] for  adults; Children under age 36, call Graduate Pediatric Dentistry at [phone]. Children aged 81-14, please call [phone] to request a pediatric application.  Dental services are provided in all areas of dental care including fillings, crowns and bridges, complete and partial dentures, implants, gum treatment, root canals, and extractions. Preventive care is also provided. Treatment is provided to both adults and children. Patients are selected via a lottery and there is often a waiting list.   [facility] [address], [place]  [phone] [email or web address]   [facility] [address] [place], [place] [phone], Ext. [phone] Second and Fourth Thursday of each month, opens at 6:30 AM; Clinic ends at 9 AM.  Patients are seen on a first-come first-served basis, and a limited number are seen during each clinic.   [facility]  [address] [place], [place] [phone]   Eligibility Requirements You must have lived in [place], [place], or [place] counties for at least the last three months.   You cannot be eligible for state or federal sponsored [place], including [employer], [place], or [employer].   You generally cannot be eligible for healthcare insurance through your employer.    How to apply: Eligibility screenings are held every Tuesday and Wednesday afternoon from 1:00 pm until 4:00 pm. You do not need an appointment for the interview!  [facility]  [address] [place], [place] [phone]   [facility] Health Department  [phone]   [facility] Health Department  [phone]   [facility] Health Department  [phone]    Behavioral Health Resources in the Community: Intensive Outpatient Programs Organization         Address  Phone  Notes  [facility] [address] [place], [place] [phone]   [facility] [address], [place], [place] [phone]   [facility] [address] [place], [place]  [phone]   [facility] [address]  [place], [place] [phone] or [phone]   Substance Abuse Resources Organization         Address  Phone  Notes  [facility]  [phone]   [facility]  [phone]   [facility]  [phone]   Floydene Flock  [phone]   Residential & Outpatient Substance Abuse Program  [phone]   Psychological Services Organization         Address  Phone  Notes  [facility]  [phone]   [facility]  [phone]   [facility] [address] [place] [phone] or [phone]    Mobile Crisis Teams Organization         Address  Phone  Notes  [facility], Mobile Crisis Care Unit  [phone]   [facility]  [address] [place], [place] [phone]   Doristine Locks [address] [place] [phone]    Self-Help/Support Groups Organization         Address  Phone             Notes  [facility] [place] - variety of support groups  [phone]- I7437963 Call for more information  Narcotics Anonymous (NA), [facility] [address], [employer] Organ  2 meetings at this location   Chief Executive Officer  Notes  [facility] [address],    [place]   [phone]   [facility]  [address] [place] [address], [place], [place] [phone]   [facility] [address] [place], [place] [phone] Admissions: 8am-3pm M-F  [facility] [address]    [place], [place] [phone]   [facility] [address] [place], [place], [place] [phone]   [facility] [address],  [place], [place] [phone]   [facility] - Intensive Outpatient [address], [place], [place] [phone]   [facility] ([facility] [address] [place]  [place], [place] [phone] or [phone]   [facility] ([facility]) [address], [place], [place] [phone] Accepts Medicaid  [facility] [address],  [place] [phone] Substance Abuse/Addiction Treatment   [facility] Organization         Address  Phone  Notes  [facility]  5748414388   Angie Fava, PhD [address] [place], [place]   [phone] or [phone]   [facility]al   [address] [place], [place] [phone]   [facility] [address] [place], [place] [phone] Insurance/Medicaid/sponsorship through [facility] [address]                                    [place], [place] [phone] Therapy/tele-psych/case  [facility] [address], [place] [phone]    Dr. Lolly Mustache  [phone]) [phone]   [facility]  [employer] [facility] Dept. 1) [address],  2) [address], [place] 3)  [address], [place] [phone]  [phone]   [facility] Child Abuse Hotline (423)[phone] or [phone] (After Hours)
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
## 2014-12-10 NOTE — ED Notes (Signed)
OD  20/20 OS   20/25-1 OU    20/25-1

## 2015-03-18 ENCOUNTER — Emergency Department (HOSPITAL_COMMUNITY)
Admission: EM | Admit: 2015-03-18 | Discharge: 2015-03-19 | Disposition: A | Discharge status: Home / Self Care | Payer: BLUE CROSS/BLUE SHIELD | Attending: Emergency Medicine | Admitting: Emergency Medicine

## 2015-03-18 DIAGNOSIS — M25532 Pain in left wrist: Secondary | ICD-10-CM | POA: Diagnosis not present

## 2015-03-18 DIAGNOSIS — M2540 Effusion, unspecified joint: Secondary | ICD-10-CM | POA: Diagnosis present

## 2015-03-19 ENCOUNTER — Encounter (HOSPITAL_COMMUNITY): Payer: Self-pay | Admitting: Emergency Medicine

## 2015-03-19 MED ORDER — NAPROXEN 500 MG PO TABS: 500.0000 mg | ORAL_TABLET | Freq: Two times a day (BID) | ORAL | Status: DC | PRN

## 2015-03-19 NOTE — ED Provider Notes (Signed)
CSN: [id number]     Arrival date & time 03/18/15  2344 History   First MD Initiated Contact with Patient 03/19/15 0030     Chief Complaint  Patient presents with  . Joint Swelling     (Consider location/radiation/quality/duration/timing/severity/associated sxs/prior Treatment) HPI Patient presents with one day of left wrist pain. No known injury. States it's worse with range of motion of the left wrist. There is been no swelling or erythema. No fever or chills. Patient states she been hearing a clicking sound when she moves her wrist. No focal weakness or numbness. History reviewed. No pertinent past medical history. Past Surgical History  Procedure Laterality Date  . Cesarean section    . Knee surgery    . Laparoscopic ovarian cystectomy     No family history on file. History  Substance Use Topics  . Smoking status: Never Smoker   . Smokeless tobacco: Not on file  . Alcohol Use: No   OB History    No data available     Review of Systems  Constitutional: Negative for fever and chills.  Musculoskeletal: Positive for arthralgias.  Skin: Negative for rash and wound.  Neurological: Negative for weakness and numbness.  All other systems reviewed and are negative.     Allergies  Aspirin and Shrimp  Home Medications   Prior to Admission medications   Medication Sig Start Date End Date Taking? Authorizing Provider  medroxyPROGESTERone (DEPO-PROVERA) 150 MG/ML injection Inject 150 mg into the muscle every 3 (three) months.   Yes Historical Provider, MD  acetaminophen (TYLENOL) 325 MG tablet Take 1 tablet (325 mg total) by mouth every 6 (six) hours as needed. Patient not taking: Reported on 03/19/2015 12/10/14   Marissa Sciacca, PA-C  naproxen (NAPROSYN) 500 MG tablet Take 1 tablet (500 mg total) by mouth 2 (two) times daily as needed for moderate pain. 03/19/15   Loren Racer, MD  traMADol (ULTRAM) 50 MG tablet Take 1 tablet (50 mg total) by mouth every 6 (six) hours as  needed. Patient not taking: Reported on 03/19/2015 01/04/14   Dione Booze, MD   BP 124/81 mmHg  Pulse 59  Temp(Src) 97.8 F (36.6 C) (Oral)  Resp 18  Ht 5\' 9"  (1.753 m)  Wt 245 lb (111.131 kg)  BMI 36.16 kg/m2  SpO2 99% Physical Exam  Constitutional: She is oriented to person, place, and time. She appears well-developed and well-nourished. No distress.  HENT:  Head: Normocephalic and atraumatic.  Mouth/Throat: Oropharynx is clear and moist.  Eyes: EOM are normal. Pupils are equal, round, and reactive to light.  Neck: Normal range of motion. Neck supple.  Cardiovascular: Normal rate and regular rhythm.   Pulmonary/Chest: Effort normal and breath sounds normal.  Abdominal: Soft. Bowel sounds are normal.  Musculoskeletal: Normal range of motion. She exhibits tenderness. She exhibits no edema.  Patient with mild tenderness with range of motion of the left wrist. There is no snuffbox tenderness. There is no obvious warmth or effusion of the wrist. Negative Tinel and Phalen sign. Patient has good distal cap refill. Normal radial pulse.  Neurological: She is alert and oriented to person, place, and time.  5/5 grip strength bilaterally. Sensation is fully intact.  Skin: Skin is warm and dry. No rash noted. No erythema.  Psychiatric: She has a normal mood and affect. Her behavior is normal.  Nursing note and vitals reviewed.   ED Course  Procedures (including critical care time) Labs Review Labs Reviewed - No data to display  Imaging Review No results found.   EKG Interpretation None      MDM   Final diagnoses:  Arthralgia of left wrist    Suspect some type of arthritis in the left wrist. No history of trauma no evidence of such. No swelling, warmth or erythema. Patient's been on naproxen in the past and tolerated well or she has a documented aspirin allergy. Will start pack a naproxen and place in splint. Patient's been given return precautions as voiced  understanding.    Loren Racer, MD 03/19/15 [phone]

## 2015-03-19 NOTE — Discharge Instructions (Signed)
Arthralgia °Your caregiver has diagnosed you as suffering from an arthralgia. Arthralgia means there is pain in a joint. This can come from many reasons including: °· Bruising the joint which causes soreness (inflammation) in the joint. °· Wear and tear on the joints which occur as we grow older (osteoarthritis). °· Overusing the joint. °· Various forms of arthritis. °· Infections of the joint. °Regardless of the cause of pain in your joint, most of these different pains respond to anti-inflammatory drugs and rest. The exception to this is when a joint is infected, and these cases are treated with antibiotics, if it is a bacterial infection. °HOME CARE INSTRUCTIONS  °· Rest the injured area for as long as directed by your caregiver. Then slowly start using the joint as directed by your caregiver and as the pain allows. Crutches as directed may be useful if the ankles, knees or hips are involved. If the knee was splinted or casted, continue use and care as directed. If an stretchy or elastic wrapping bandage has been applied today, it should be removed and re-applied every 3 to 4 hours. It should not be applied tightly, but firmly enough to keep swelling down. Watch toes and feet for swelling, bluish discoloration, coldness, numbness or excessive pain. If any of these problems (symptoms) occur, remove the ace bandage and re-apply more loosely. If these symptoms persist, contact your caregiver or return to this location. °· For the first 24 hours, keep the injured extremity elevated on pillows while lying down. °· Apply ice for 15-20 minutes to the sore joint every couple hours while awake for the first half day. Then 03-04 times per day for the first 48 hours. Put the ice in a plastic bag and place a towel between the bag of ice and your skin. °· Wear any splinting, casting, elastic bandage applications, or slings as instructed. °· Only take over-the-counter or prescription medicines for pain, discomfort, or fever as  directed by your caregiver. Do not use aspirin immediately after the injury unless instructed by your physician. Aspirin can cause increased bleeding and bruising of the tissues. °· If you were given crutches, continue to use them as instructed and do not resume weight bearing on the sore joint until instructed. °Persistent pain and inability to use the sore joint as directed for more than 2 to 3 days are warning signs indicating that you should see a caregiver for a follow-up visit as soon as possible. Initially, a hairline fracture (break in bone) may not be evident on X-rays. Persistent pain and swelling indicate that further evaluation, non-weight bearing or use of the joint (use of crutches or slings as instructed), or further X-rays are indicated. X-rays may sometimes not show a small fracture until a week or 10 days later. Make a follow-up appointment with your own caregiver or one to whom we have referred you. A radiologist (specialist in reading X-rays) may read your X-rays. Make sure you know how you are to obtain your X-ray results. Do not assume everything is normal if you do not hear from us. °SEEK MEDICAL CARE IF: °Bruising, swelling, or pain increases. °SEEK IMMEDIATE MEDICAL CARE IF:  °· Your fingers or toes are numb or blue. °· The pain is not responding to medications and continues to stay the same or get worse. °· The pain in your joint becomes severe. °· You develop a fever over 102° F (38.9° C). °· It becomes impossible to move or use the joint. °MAKE SURE YOU:  °·   Understand these instructions. °· Will watch your condition. °· Will get help right away if you are not doing well or get worse. °Document Released: 09/23/2005 Document Revised: 12/16/2011 Document Reviewed: 05/11/2008 °Exit[employer]® Patient Information ©2015 [employer]. This information is not intended to replace advice given to you by your health care provider. Make sure you discuss any questions you have with your health care  provider. ° °Arthritis, Nonspecific °Arthritis is inflammation of a joint. This usually means pain, redness, warmth or swelling are present. One or more joints may be involved. There are a number of types of arthritis. Your caregiver may not be able to tell what type of arthritis you have right away. °CAUSES  °The most common cause of arthritis is the wear and tear on the joint (osteoarthritis). This causes damage to the cartilage, which can break down over time. The knees, hips, back and neck are most often affected by this type of arthritis. °Other types of arthritis and common causes of joint pain include: °· Sprains and other injuries near the joint. Sometimes minor sprains and injuries cause pain and swelling that develop hours later. °· Rheumatoid arthritis. This affects hands, feet and knees. It usually affects both sides of your body at the same time. It is often associated with chronic ailments, fever, weight loss and general weakness. °· Crystal arthritis. Gout and pseudo gout can cause occasional acute severe pain, redness and swelling in the foot, ankle, or knee. °· Infectious arthritis. Bacteria can get into a joint through a break in overlying skin. This can cause infection of the joint. Bacteria and viruses can also spread through the blood and affect your joints. °· Drug, infectious and allergy reactions. Sometimes joints can become mildly painful and slightly swollen with these types of illnesses. °SYMPTOMS  °· Pain is the main symptom. °· Your joint or joints can also be red, swollen and warm or hot to the touch. °· You may have a fever with certain types of arthritis, or even feel overall ill. °· The joint with arthritis will hurt with movement. Stiffness is present with some types of arthritis. °DIAGNOSIS  °Your caregiver will suspect arthritis based on your description of your symptoms and on your exam. Testing may be needed to find the type of arthritis: °· Blood and sometimes urine  tests. °· X-ray tests and sometimes CT or MRI scans. °· Removal of fluid from the joint (arthrocentesis) is done to check for bacteria, crystals or other causes. Your caregiver (or a specialist) will numb the area over the joint with a local anesthetic, and use a needle to remove joint fluid for examination. This procedure is only minimally uncomfortable. °· Even with these tests, your caregiver may not be able to tell what kind of arthritis you have. Consultation with a specialist (rheumatologist) may be helpful. °TREATMENT  °Your caregiver will discuss with you treatment specific to your type of arthritis. If the specific type cannot be determined, then the following general recommendations may apply. °Treatment of severe joint pain includes: °· Rest. °· Elevation. °· Anti-inflammatory medication (for example, ibuprofen) may be prescribed. Avoiding activities that cause increased pain. °· Only take over-the-counter or prescription medicines for pain and discomfort as recommended by your caregiver. °· Cold packs over an inflamed joint may be used for 10 to 15 minutes every hour. Hot packs sometimes feel better, but do not use overnight. Do not use hot packs if you are diabetic without your caregiver's permission. °· A cortisone shot into arthritic   joints may help reduce pain and swelling. °· Any acute arthritis that gets worse over the next 1 to 2 days needs to be looked at to be sure there is no joint infection. °Long-term arthritis treatment involves modifying activities and lifestyle to reduce joint stress jarring. This can include weight loss. Also, exercise is needed to nourish the joint cartilage and remove waste. This helps keep the muscles around the joint strong. °HOME CARE INSTRUCTIONS  °· Do not take aspirin to relieve pain if gout is suspected. This elevates uric acid levels. °· Only take over-the-counter or prescription medicines for pain, discomfort or fever as directed by your caregiver. °· Rest the  joint as much as possible. °· If your joint is swollen, keep it elevated. °· Use crutches if the painful joint is in your leg. °· Drinking plenty of fluids may help for certain types of arthritis. °· Follow your caregiver's dietary instructions. °· Try low-impact exercise such as: °¨ Swimming. °¨ Water aerobics. °¨ Biking. °¨ Walking. °· Morning stiffness is often relieved by a warm shower. °· Put your joints through regular range-of-motion. °SEEK MEDICAL CARE IF:  °· You do not feel better in 24 hours or are getting worse. °· You have side effects to medications, or are not getting better with treatment. °SEEK IMMEDIATE MEDICAL CARE IF:  °· You have a fever. °· You develop severe joint pain, swelling or redness. °· Many joints are involved and become painful and swollen. °· There is severe back pain and/or leg weakness. °· You have loss of bowel or bladder control. °Document Released: 10/31/2004 Document Revised: 12/16/2011 Document Reviewed: 11/16/2008 °ExitCare® Patient Information ©2015 [employer]. This information is not intended to replace advice given to you by your health care provider. Make sure you discuss any questions you have with your health care provider. ° °

## 2015-03-19 NOTE — ED Notes (Signed)
Pt states she woke up yesterday morning with swelling to L wrist, pt denies injury

## 2015-07-01 ENCOUNTER — Emergency Department (HOSPITAL_COMMUNITY)
Admission: EM | Admit: 2015-07-01 | Discharge: 2015-07-01 | Disposition: A | Discharge status: Home / Self Care | Payer: BLUE CROSS/BLUE SHIELD | Attending: Emergency Medicine | Admitting: Emergency Medicine

## 2015-07-01 ENCOUNTER — Encounter (HOSPITAL_COMMUNITY): Payer: Self-pay

## 2015-07-01 DIAGNOSIS — Y9289 Other specified places as the place of occurrence of the external cause: Secondary | ICD-10-CM | POA: Diagnosis not present

## 2015-07-01 DIAGNOSIS — Y9389 Activity, other specified: Secondary | ICD-10-CM | POA: Insufficient documentation

## 2015-07-01 DIAGNOSIS — Y998 Other external cause status: Secondary | ICD-10-CM | POA: Diagnosis not present

## 2015-07-01 DIAGNOSIS — W51XXXA Accidental striking against or bumped into by another person, initial encounter: Secondary | ICD-10-CM | POA: Insufficient documentation

## 2015-07-01 DIAGNOSIS — S6992XA Unspecified injury of left wrist, hand and finger(s), initial encounter: Secondary | ICD-10-CM | POA: Diagnosis not present

## 2015-07-01 MED ORDER — IBUPROFEN 800 MG PO TABS: 800.0000 mg | ORAL_TABLET | Freq: Three times a day (TID) | ORAL | Status: DC

## 2015-07-01 NOTE — Discharge Instructions (Signed)
RICE: Routine Care for Injuries The routine care of many injuries includes Rest, Ice, Compression, and Elevation (RICE). HOME CARE INSTRUCTIONS  Rest is needed to allow your body to heal. Routine activities can usually be resumed when comfortable. Injured tendons and bones can take up to 6 weeks to heal. Tendons are the cord-like structures that attach muscle to bone.  Ice following an injury helps keep the swelling down and reduces pain.  Put ice in a plastic bag.  Place a towel between your skin and the bag.  Leave the ice on for 15-20 minutes, 3-4 times a day, or as directed by your health care provider. Do this while awake, for the first 24 to 48 hours. After that, continue as directed by your caregiver.  Compression helps keep swelling down. It also gives support and helps with discomfort. If an elastic bandage has been applied, it should be removed and reapplied every 3 to 4 hours. It should not be applied tightly, but firmly enough to keep swelling down. Watch fingers or toes for swelling, bluish discoloration, coldness, numbness, or excessive pain. If any of these problems occur, remove the bandage and reapply loosely. Contact your caregiver if these problems continue.  Elevation helps reduce swelling and decreases pain. With extremities, such as the arms, hands, legs, and feet, the injured area should be placed near or above the level of the heart, if possible. SEEK IMMEDIATE MEDICAL CARE IF:  You have persistent pain and swelling.  You develop redness, numbness, or unexpected weakness.  Your symptoms are getting worse rather than improving after several days. These symptoms may indicate that further evaluation or further X-rays are needed. Sometimes, X-rays may not show a small broken bone (fracture) until 1 week or 10 days later. Make a follow-up appointment with your caregiver. Ask when your X-ray results will be ready. Make sure you get your X-ray results. Document Released:  01/05/2001 Document Revised: 09/28/2013 Document Reviewed: 02/22/2011 Exit[employer] Patient Information 2015 [employer]. This information is not intended to replace advice given to you by your health care provider. Make sure you discuss any questions you have with your health care provider.  

## 2015-07-01 NOTE — ED Notes (Signed)
Ice applied
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
## 2015-07-01 NOTE — ED Notes (Signed)
Patient left at this time with all belongings. 

## 2016-03-14 ENCOUNTER — Emergency Department (HOSPITAL_COMMUNITY)
Admission: EM | Admit: 2016-03-14 | Discharge: 2016-03-14 | Disposition: A | Discharge status: Home / Self Care | Payer: BLUE CROSS/BLUE SHIELD | Attending: Emergency Medicine | Admitting: Emergency Medicine

## 2016-03-14 ENCOUNTER — Encounter (HOSPITAL_COMMUNITY): Payer: Self-pay | Admitting: Emergency Medicine

## 2016-03-14 DIAGNOSIS — M25531 Pain in right wrist: Secondary | ICD-10-CM | POA: Insufficient documentation

## 2016-03-14 MED ORDER — IBUPROFEN 400 MG PO TABS
800.0000 mg | ORAL_TABLET | Freq: Once | ORAL | Status: AC
  Administered 2016-03-14: 800 mg via ORAL
  Filled 2016-03-14: qty 2

## 2016-03-14 MED ORDER — IBUPROFEN 800 MG PO TABS: 800.0000 mg | ORAL_TABLET | Freq: Three times a day (TID) | ORAL | Status: DC

## 2016-03-14 NOTE — ED Provider Notes (Signed)
CSN: [id number]     Arrival date & time 03/14/16  0931 History   First MD Initiated Contact with Patient 03/14/16 [phone]     Chief Complaint  Patient presents with  . Hand Pain   HPI  Rebecca Mccarthy is a 35 year old female with no pertinent past medical history presenting with right wrist pain and right hand weakness. Onset of symptoms was last evening. Patient states she was in her normal state of health she felt an aching pain on her right wrist that radiated up her arm. Pain is exacerbated on flexion and extension at the wrist and finger movement. Holding the wrist still somewhat alleviates the pain. She has not taken any over-the-counter medications for this. She denies possible overuse injury. Denies history of carpal tunnel. She states that her hand feels weak and she has been dropping objects. She also believes her right wrist has been swelling over the past 24 hours. Denies redness of the skin or warmth of the wrist joint. Denies injury to the wrist. Denies weakness at the elbow or shoulder. Denies headache, vision change, facial droop or speech slurring. Denies numbness, tingling or loss of sensation in the hand.  History reviewed. No pertinent past medical history. Past Surgical History  Procedure Laterality Date  . Cesarean section    . Knee surgery    . Laparoscopic ovarian cystectomy     No family history on file. Social History  Substance Use Topics  . Smoking status: Never Smoker   . Smokeless tobacco: None  . Alcohol Use: No   OB History    No data available     Review of Systems  All other systems reviewed and are negative.     Allergies  Aspirin and Shrimp  Home Medications   Prior to Admission medications   Medication Sig Start Date End Date Taking? Authorizing Provider  acetaminophen (TYLENOL) 325 MG tablet Take 1 tablet (325 mg total) by mouth every 6 (six) hours as needed. Patient not taking: Reported on 03/19/2015 12/10/14   Rebecca Sciacca, PA-C  ibuprofen  (ADVIL,MOTRIN) 800 MG tablet Take 1 tablet (800 mg total) by mouth 3 (three) times daily. 07/01/15   Fayrene Helper, PA-C  ibuprofen (ADVIL,MOTRIN) 800 MG tablet Take 1 tablet (800 mg total) by mouth 3 (three) times daily. 03/14/16   Jireh Elmore, PA-C  medroxyPROGESTERone (DEPO-PROVERA) 150 MG/ML injection Inject 150 mg into the muscle every 3 (three) months.    Historical Provider, MD  naproxen (NAPROSYN) 500 MG tablet Take 1 tablet (500 mg total) by mouth 2 (two) times daily as needed for moderate pain. 03/19/15   Loren Racer, MD  traMADol (ULTRAM) 50 MG tablet Take 1 tablet (50 mg total) by mouth every 6 (six) hours as needed. Patient not taking: Reported on 03/19/2015 01/04/14   Dione Booze, MD   BP 129/86 mmHg  Pulse 63  Temp(Src) 98.4 F (36.9 C) (Oral)  Resp 20  Ht  (1.778 m)  Wt 117.935 kg  BMI 37.31 kg/m2  SpO2 100% Physical Exam  Constitutional: She appears well-developed and well-nourished. No distress.  HENT:  Head: Normocephalic and atraumatic.  Eyes: Conjunctivae are normal. Right eye exhibits no discharge. Left eye exhibits no discharge. No scleral icterus.  Neck: Normal range of motion.  Cardiovascular: Normal rate, regular rhythm and intact distal pulses.   Pulmonary/Chest: Effort normal. No respiratory distress.  Musculoskeletal: Normal range of motion.       Right wrist: She exhibits tenderness. She exhibits normal range  of motion, no swelling and no deformity.  Full range of motion of the right wrist and digits intact. Mild tenderness to palpation of the wrist joint. No appreciable swelling. No erythema or warmth. Full range of motion at the elbow and shoulder are intact. Radial pulse palpable. Cap refill less than 2 seconds.  Neurological: She is alert. Coordination normal.  5/5 grip strength of left hand and 4/5 grip strength of right. Sensation to light touch intact throughout. 5/5 strength at right elbow and shoulder. No gross cranial nerve deficits. Patient has  coherent speech and ambulates with a steady, coordinated gait.  Skin: Skin is warm and dry.  Psychiatric: She has a normal mood and affect. Her behavior is normal.  Nursing note and vitals reviewed.   ED Course  Procedures (including critical care time) Labs Review Labs Reviewed - No data to display  Imaging Review No results found. I have personally reviewed and evaluated these images and lab results as part of my medical decision-making.   EKG Interpretation None      MDM   Final diagnoses:  Right wrist pain   35 year old female presenting with right wrist pain and hand weakness 1 day. Afebrile hemodynamically stable. Full range of motion at the right wrist and digits. Mild tenderness to palpation of the right wrist without swelling, warmth or erythema. 4/5 grip strength of the right hand when compared to the left. No sensory deficits. 5/5 strength at elbow and shoulder of right upper extremity. Radial pulse palpable. Symptoms are atraumatic; no imaging indicated at this time. Presentation is consistent with an arthritic joint and unlikely to be a neurological etiology. Pain treated with ibuprofen in emergency department. Patient seen by my attending, Dr. Adriana Simasook, who agrees with this assessment and plan. Will discharge with ibuprofen and PCP follow-up for possible referral to orthopedics. Return precautions given in discharge paperwork and discussed with pt at bedside. Pt stable for discharge    Alveta HeimlichStevi Dondra Rhett, PA-C 03/14/16 1017  Donnetta HutchingBrian Cook, MD 03/15/16 1315

## 2016-03-14 NOTE — ED Notes (Signed)
Pt c/o right hand 'weakness' since yesterday. Also c/o wrist pain radiating up arm.

## 2016-03-14 NOTE — Discharge Instructions (Signed)
Wrist Pain There are many things that can cause wrist pain. Some common causes include:  An injury to the wrist area, such as a sprain, strain, or fracture.  Overuse of the joint.  A condition that causes increased pressure on a nerve in the wrist (carpal tunnel syndrome).  Wear and tear of the joints that occurs with aging (osteoarthritis).  A variety of other types of arthritis. Sometimes, the cause of wrist pain is not known. The pain often goes away when you follow your health care provider's instructions for relieving pain at home. If your wrist pain continues, tests may need to be done to diagnose your condition. HOME CARE INSTRUCTIONS Pay attention to any changes in your symptoms. Take these actions to help with your pain:  Rest the wrist area for at least 48 hours or as told by your health care provider.  If directed, apply ice to the injured area:  Put ice in a plastic bag.  Place a towel between your skin and the bag.  Leave the ice on for 20 minutes, 2-3 times per day.  Keep your arm raised (elevated) above the level of your heart while you are sitting or lying down.  If a splint or elastic bandage has been applied, use it as told by your health care provider.  Remove the splint or bandage only as told by your health care provider.  Loosen the splint or bandage if your fingers become numb or have a tingling feeling, or if they turn cold or blue.  Take over-the-counter and prescription medicines only as told by your health care provider.  Keep all follow-up visits as told by your health care provider. This is important. SEEK MEDICAL CARE IF:  Your pain is not helped by treatment.  Your pain gets worse. SEEK IMMEDIATE MEDICAL CARE IF:  Your fingers become swollen.  Your fingers turn white, very red, or cold and blue.  Your fingers are numb or have a tingling feeling.  You have difficulty moving your fingers.   This information is not intended to replace  advice given to you by your health care provider. Make sure you discuss any questions you have with your health care provider.   Document Released: 07/03/2005 Document Revised: 06/14/2015 Document Reviewed: 02/08/2015 Elsevier Interactive Patient Education 2016 [employer]  

## 2016-10-24 ENCOUNTER — Ambulatory Visit (HOSPITAL_COMMUNITY)
Admission: EM | Admit: 2016-10-24 | Discharge: 2016-10-24 | Disposition: A | Discharge status: Home / Self Care | Payer: Worker's Compensation | Attending: Emergency Medicine | Admitting: Emergency Medicine

## 2016-10-24 ENCOUNTER — Ambulatory Visit (INDEPENDENT_AMBULATORY_CARE_PROVIDER_SITE_OTHER): Payer: Worker's Compensation

## 2016-10-24 ENCOUNTER — Encounter (HOSPITAL_COMMUNITY): Payer: Self-pay | Admitting: Emergency Medicine

## 2016-10-24 DIAGNOSIS — S6991XA Unspecified injury of right wrist, hand and finger(s), initial encounter: Secondary | ICD-10-CM

## 2016-10-24 MED ORDER — DICLOFENAC SODIUM 75 MG PO TBEC: 75.0000 mg | DELAYED_RELEASE_TABLET | Freq: Two times a day (BID) | ORAL | 0 refills | Status: DC
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
## 2016-10-24 NOTE — Discharge Instructions (Signed)
Your Xray results are negative for fracture or dislocation. I have written for diclofenac, take 1 tablet twice a day for pain. Rest the affected extremity, apply ice 15 minutes at a time 4 times a day. Should you need additional management, follow up with [facility] health [facility] as needed.
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
## 2017-06-29 ENCOUNTER — Encounter (HOSPITAL_COMMUNITY): Payer: Self-pay | Admitting: Emergency Medicine

## 2017-06-29 ENCOUNTER — Emergency Department (HOSPITAL_COMMUNITY)
Admission: EM | Admit: 2017-06-29 | Discharge: 2017-06-29 | Disposition: A | Discharge status: Home / Self Care | Payer: BLUE CROSS/BLUE SHIELD | Attending: Emergency Medicine | Admitting: Emergency Medicine

## 2017-06-29 DIAGNOSIS — Y939 Activity, unspecified: Secondary | ICD-10-CM | POA: Insufficient documentation

## 2017-06-29 DIAGNOSIS — S0083XA Contusion of other part of head, initial encounter: Secondary | ICD-10-CM | POA: Diagnosis not present

## 2017-06-29 DIAGNOSIS — T148XXA Other injury of unspecified body region, initial encounter: Secondary | ICD-10-CM | POA: Insufficient documentation

## 2017-06-29 DIAGNOSIS — Y929 Unspecified place or not applicable: Secondary | ICD-10-CM | POA: Insufficient documentation

## 2017-06-29 DIAGNOSIS — Y998 Other external cause status: Secondary | ICD-10-CM | POA: Diagnosis not present

## 2017-06-29 DIAGNOSIS — Z23 Encounter for immunization: Secondary | ICD-10-CM | POA: Insufficient documentation

## 2017-06-29 DIAGNOSIS — G44319 Acute post-traumatic headache, not intractable: Secondary | ICD-10-CM | POA: Diagnosis not present

## 2017-06-29 DIAGNOSIS — T07XXXA Unspecified multiple injuries, initial encounter: Secondary | ICD-10-CM

## 2017-06-29 DIAGNOSIS — S0993XA Unspecified injury of face, initial encounter: Secondary | ICD-10-CM | POA: Diagnosis present

## 2017-06-29 MED ORDER — IBUPROFEN 400 MG PO TABS
800.0000 mg | ORAL_TABLET | Freq: Once | ORAL | Status: AC
  Administered 2017-06-29: 800 mg via ORAL
  Filled 2017-06-29: qty 2

## 2017-06-29 MED ORDER — TETANUS-DIPHTH-ACELL PERTUSSIS 5-2.5-18.5 LF-MCG/0.5 IM SUSP
0.5000 mL | Freq: Once | INTRAMUSCULAR | Status: AC
Start: 2017-06-29 — End: 2017-06-29
  Administered 2017-06-29: 0.5 mL via INTRAMUSCULAR
  Filled 2017-06-29: qty 0.5

## 2017-06-29 MED ORDER — BUTALBITAL-APAP-CAFFEINE 50-325-40 MG PO TABS: 1.0000 | ORAL_TABLET | Freq: Three times a day (TID) | ORAL | 0 refills | Status: AC | PRN
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
## 2017-09-08 ENCOUNTER — Emergency Department (HOSPITAL_COMMUNITY)
Admission: EM | Admit: 2017-09-08 | Discharge: 2017-09-08 | Disposition: A | Discharge status: Home / Self Care | Payer: BLUE CROSS/BLUE SHIELD | Attending: Emergency Medicine | Admitting: Emergency Medicine

## 2017-09-08 ENCOUNTER — Encounter (HOSPITAL_COMMUNITY): Payer: Self-pay | Admitting: *Deleted

## 2017-09-08 DIAGNOSIS — Y9389 Activity, other specified: Secondary | ICD-10-CM | POA: Diagnosis not present

## 2017-09-08 DIAGNOSIS — Y92410 Unspecified street and highway as the place of occurrence of the external cause: Secondary | ICD-10-CM | POA: Insufficient documentation

## 2017-09-08 DIAGNOSIS — Z79899 Other long term (current) drug therapy: Secondary | ICD-10-CM | POA: Insufficient documentation

## 2017-09-08 DIAGNOSIS — R110 Nausea: Secondary | ICD-10-CM | POA: Diagnosis not present

## 2017-09-08 DIAGNOSIS — Y999 Unspecified external cause status: Secondary | ICD-10-CM | POA: Insufficient documentation

## 2017-09-08 MED ORDER — CYCLOBENZAPRINE HCL 10 MG PO TABS: 10.0000 mg | ORAL_TABLET | Freq: Two times a day (BID) | ORAL | 0 refills | Status: DC | PRN

## 2017-09-08 MED ORDER — ONDANSETRON 4 MG PO TBDP
4.0000 mg | ORAL_TABLET | Freq: Once | ORAL | Status: AC
  Administered 2017-09-08: 4 mg via ORAL
  Filled 2017-09-08: qty 1

## 2017-09-08 MED ORDER — OXYCODONE-ACETAMINOPHEN 5-325 MG PO TABS: 1.0000 | ORAL_TABLET | ORAL | Status: DC | PRN

## 2017-09-08 MED ORDER — ACETAMINOPHEN 500 MG PO TABS
500.0000 mg | ORAL_TABLET | Freq: Once | ORAL | Status: AC
  Administered 2017-09-08: 500 mg via ORAL
  Filled 2017-09-08: qty 1
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
## 2018-10-18 ENCOUNTER — Other Ambulatory Visit: Payer: Self-pay

## 2018-10-18 ENCOUNTER — Encounter (HOSPITAL_COMMUNITY): Payer: Self-pay | Admitting: Obstetrics and Gynecology

## 2018-10-18 ENCOUNTER — Emergency Department (HOSPITAL_COMMUNITY)
Admission: EM | Admit: 2018-10-18 | Discharge: 2018-10-18 | Disposition: A | Discharge status: Home / Self Care | Payer: BLUE CROSS/BLUE SHIELD | Attending: Emergency Medicine | Admitting: Emergency Medicine

## 2018-10-18 ENCOUNTER — Emergency Department (HOSPITAL_COMMUNITY): Payer: BLUE CROSS/BLUE SHIELD

## 2018-10-18 DIAGNOSIS — R062 Wheezing: Secondary | ICD-10-CM

## 2018-10-18 DIAGNOSIS — Z79899 Other long term (current) drug therapy: Secondary | ICD-10-CM | POA: Insufficient documentation

## 2018-10-18 DIAGNOSIS — R079 Chest pain, unspecified: Secondary | ICD-10-CM | POA: Diagnosis present

## 2018-10-18 DIAGNOSIS — J069 Acute upper respiratory infection, unspecified: Secondary | ICD-10-CM | POA: Diagnosis not present

## 2018-10-18 LAB — CBC
HEMATOCRIT: 41 % (ref 36.0–46.0)
Hemoglobin: 13.4 g/dL (ref 12.0–15.0)
MCH: 30 pg (ref 26.0–34.0)
MCHC: 32.7 g/dL (ref 30.0–36.0)
MCV: 91.9 fL (ref 80.0–100.0)
PLATELETS: 264 10*3/uL (ref 150–400)
RBC: 4.46 MIL/uL (ref 3.87–5.11)
RDW: 13.6 % (ref 11.5–15.5)
WBC: 5.9 10*3/uL (ref 4.0–10.5)
nRBC: 0 % (ref 0.0–0.2)

## 2018-10-18 LAB — URINALYSIS, ROUTINE W REFLEX MICROSCOPIC
BILIRUBIN URINE: NEGATIVE
GLUCOSE, UA: NEGATIVE mg/dL
Hgb urine dipstick: NEGATIVE
KETONES UR: 5 mg/dL — AB
LEUKOCYTES UA: NEGATIVE
NITRITE: NEGATIVE
PH: 6 (ref 5.0–8.0)
Protein, ur: NEGATIVE mg/dL
SPECIFIC GRAVITY, URINE: 1.018 (ref 1.005–1.030)

## 2018-10-18 LAB — BASIC METABOLIC PANEL
Anion gap: 10 (ref 5–15)
BUN: 11 mg/dL (ref 6–20)
CALCIUM: 9.6 mg/dL (ref 8.9–10.3)
CHLORIDE: 104 mmol/L (ref 98–111)
CO2: 22 mmol/L (ref 22–32)
CREATININE: 1.07 mg/dL — AB (ref 0.44–1.00)
GFR calc Af Amer: >60 mL/min (ref >60)
GFR calc non Af Amer: >60 mL/min (ref >60)
GLUCOSE: 86 mg/dL (ref 70–99)
Potassium: 4.3 mmol/L (ref 3.5–5.1)
Sodium: 136 mmol/L (ref 135–145)

## 2018-10-18 LAB — POCT I-STAT TROPONIN I: TROPONIN I, POC: 0 ng/mL (ref 0.00–0.08)

## 2018-10-18 LAB — I-STAT BETA HCG BLOOD, ED (NOT ORDERABLE): I-stat hCG, quantitative: <5 m[IU]/mL (ref <5)

## 2018-10-18 LAB — D-DIMER, QUANTITATIVE: D-Dimer, Quant: 0.59 ug/mL-FEU — ABNORMAL HIGH (ref 0.00–0.50)

## 2018-10-18 MED ORDER — ALBUTEROL SULFATE HFA 108 (90 BASE) MCG/ACT IN AERS
2.0000 | INHALATION_SPRAY | Freq: Once | RESPIRATORY_TRACT | Status: AC
  Administered 2018-10-18: 2 via RESPIRATORY_TRACT
  Filled 2018-10-18: qty 6.7

## 2018-10-18 MED ORDER — IOPAMIDOL (ISOVUE-370) INJECTION 76%
100.0000 mL | Freq: Once | INTRAVENOUS | Status: AC | PRN
  Administered 2018-10-18: 100 mL via INTRAVENOUS

## 2018-10-18 MED ORDER — PREDNISONE 50 MG PO TABS: 50.0000 mg | ORAL_TABLET | Freq: Every day | ORAL | 0 refills | Status: DC

## 2018-10-18 MED ORDER — IPRATROPIUM-ALBUTEROL 0.5-2.5 (3) MG/3ML IN SOLN
3.0000 mL | Freq: Once | RESPIRATORY_TRACT | Status: AC
  Administered 2018-10-18: 3 mL via RESPIRATORY_TRACT
  Filled 2018-10-18: qty 3

## 2018-10-18 MED ORDER — SODIUM CHLORIDE (PF) 0.9 % IJ SOLN
INTRAMUSCULAR | Status: AC
  Filled 2018-10-18: qty 50

## 2018-10-18 MED ORDER — PREDNISONE 20 MG PO TABS
60.0000 mg | ORAL_TABLET | Freq: Once | ORAL | Status: AC
  Administered 2018-10-18: 60 mg via ORAL
  Filled 2018-10-18: qty 3

## 2018-10-18 MED ORDER — IOPAMIDOL (ISOVUE-370) INJECTION 76%
INTRAVENOUS | Status: AC
  Filled 2018-10-18: qty 100

## 2018-10-18 NOTE — ED Notes (Addendum)
Blood draw attempt unsuccessful.  RN notified.  Pt aware that urine is needed.  Pt unable at this time.

## 2018-10-18 NOTE — ED Provider Notes (Signed)
[facility]-EMERGENCY DEPT Provider Note   CSN: [id number] Arrival date & time: 10/18/18  1454     History   Chief Complaint Chief Complaint  Patient presents with  . Cough  . Chest Pain    HPI Rebecca Mccarthy is a 38 y.o. female.  The history is provided by the patient and medical records. No language interpreter was used.  Chest Pain  Pain location:  Substernal area Pain quality: aching   Pain radiates to:  Does not radiate Pain severity:  Severe Onset quality:  Gradual Duration:  1 day Timing:  Intermittent Progression:  Waxing and waning Chronicity:  New Context: breathing   Relieved by:  Nothing Worsened by:  Coughing and deep breathing Ineffective treatments:  None tried Associated symptoms: cough and shortness of breath   Associated symptoms: no abdominal pain, no anorexia, no back pain, no diaphoresis, no dizziness, no dysphagia, no fatigue, no fever, no headache, no lower extremity edema, no nausea, no near-syncope, no numbness, no syncope, no vomiting and no weakness     History reviewed. No pertinent past medical history.  There are no active problems to display for this patient.   Past Surgical History:  Procedure Laterality Date  . CESAREAN SECTION    . KNEE SURGERY    . LAPAROSCOPIC OVARIAN CYSTECTOMY       OB History    Gravida      Para      Term      Preterm      AB      Living  3     SAB      TAB      Ectopic      Multiple      Live Births               Home Medications    Prior to Admission medications   Medication Sig Start Date End Date Taking? Authorizing Provider  acetaminophen (TYLENOL) 325 MG tablet Take 1 tablet (325 mg total) by mouth every 6 (six) hours as needed. Patient not taking: Reported on 03/19/2015 12/10/14   Sciacca, Ashok Cordia, PA-C  cyclobenzaprine (FLEXERIL) 10 MG tablet Take 1 tablet (10 mg total) by mouth 2 (two) times daily as needed for muscle spasms. 09/08/17   Maczis,  Elmer Sow, PA-C  diclofenac (VOLTAREN) 75 MG EC tablet Take 1 tablet (75 mg total) by mouth 2 (two) times daily. Patient not taking: Reported on 09/08/2017 10/24/16   Dorena Bodo, NP  ibuprofen (ADVIL,MOTRIN) 800 MG tablet Take 1 tablet (800 mg total) by mouth 3 (three) times daily. Patient not taking: Reported on 09/08/2017 07/01/15   Fayrene Helper, PA-C  ibuprofen (ADVIL,MOTRIN) 800 MG tablet Take 1 tablet (800 mg total) by mouth 3 (three) times daily. 03/14/16   Barrett, Rolm Gala, PA-C  medroxyPROGESTERone (DEPO-PROVERA) 150 MG/ML injection Inject 150 mg into the muscle every 3 (three) months.    [provider]  naproxen (NAPROSYN) 500 MG tablet Take 1 tablet (500 mg total) by mouth 2 (two) times daily as needed for moderate pain. Patient not taking: Reported on 09/08/2017 03/19/15   Loren Racer, MD  Specialty Vitamins Products (VITAMINS FOR HAIR PO) Take 1 tablet by mouth daily.    [provider]  traMADol (ULTRAM) 50 MG tablet Take 1 tablet (50 mg total) by mouth every 6 (six) hours as needed. Patient not taking: Reported on 03/19/2015 01/04/14   Dione Booze, MD    Family History No family  history on file.  Social History Social History   Tobacco Use  . Smoking status: Never Smoker  . Smokeless tobacco: Never Used  Substance Use Topics  . Alcohol use: No  . Drug use: No     Allergies   Aspirin; Penicillins; and Shrimp [shellfish allergy]   Review of Systems Review of Systems  Constitutional: Negative for chills, diaphoresis, fatigue and fever.  HENT: Negative for congestion, rhinorrhea and trouble swallowing.   Respiratory: Positive for cough, chest tightness, shortness of breath and wheezing. Negative for stridor.   Cardiovascular: Positive for chest pain. Negative for syncope and near-syncope.  Gastrointestinal: Negative for abdominal pain, anorexia, constipation, diarrhea, nausea and vomiting.  Genitourinary: Negative for dysuria and flank pain.    Musculoskeletal: Negative for back pain, neck pain and neck stiffness.  Skin: Negative for rash and wound.  Neurological: Negative for dizziness, weakness, light-headedness, numbness and headaches.  Psychiatric/Behavioral: Negative for agitation.  All other systems reviewed and are negative.    Physical Exam Updated Vital Signs BP 133/90 (BP Location: Left Arm)   Pulse 77   Temp 98.7 F (37.1 C) (Oral)   Resp 16   Ht 5\' 9"  (1.753 m)   Wt 133.8 kg   SpO2 100%   BMI 43.56 kg/m   Physical Exam Vitals signs and nursing note reviewed.  Constitutional:      General: She is not in acute distress.    Appearance: She is well-developed. She is not ill-appearing, toxic-appearing or diaphoretic.  HENT:     Head: Normocephalic and atraumatic.  Eyes:     Conjunctiva/sclera: Conjunctivae normal.     Pupils: Pupils are equal, round, and reactive to light.  Neck:     Musculoskeletal: Neck supple.  Cardiovascular:     Rate and Rhythm: Normal rate and regular rhythm.     Heart sounds: No murmur.  Pulmonary:     Effort: Pulmonary effort is normal. No respiratory distress.     Breath sounds: Wheezing present. No decreased breath sounds, rhonchi or rales.  Chest:     Chest wall: Tenderness present.  Abdominal:     Palpations: Abdomen is soft.     Tenderness: There is no abdominal tenderness.  Musculoskeletal:     Right lower leg: She exhibits no tenderness. No edema.     Left lower leg: She exhibits no tenderness. No edema.  Skin:    General: Skin is warm and dry.     Capillary Refill: Capillary refill takes less than 2 seconds.  Neurological:     General: No focal deficit present.     Mental Status: She is alert.  Psychiatric:        Mood and Affect: Mood normal.      ED Treatments / Results  Labs (all labs ordered are listed, but only abnormal results are displayed) Labs Reviewed  BASIC METABOLIC PANEL - Abnormal; Notable for the following components:      Result Value    Creatinine, Ser 1.07 (*)    All other components within normal limits  D-DIMER, QUANTITATIVE (NOT AT [facility]) - Abnormal; Notable for the following components:   D-Dimer, Quant 0.59 (*)    All other components within normal limits  URINALYSIS, ROUTINE W REFLEX MICROSCOPIC - Abnormal; Notable for the following components:   Ketones, ur 5 (*)    All other components within normal limits  URINE CULTURE  CBC  I-STAT TROPONIN, ED  I-STAT BETA HCG BLOOD, ED ([facility], [facility], [facility] ONLY)  I-STAT  BETA HCG BLOOD, ED (NOT ORDERABLE)  POCT I-STAT TROPONIN I    EKG EKG Interpretation  Date/Time:  Sunday October 18 2018 15:16:49 EST Ventricular Rate:  66 PR Interval:    QRS Duration: 81 QT Interval:  371 QTC Calculation: 389 R Axis:   -17 Text Interpretation:  Sinus rhythm LVH by voltage Baseline wander in lead(s) II V2 V6 When compared to prior, more wandering baseline.  No STEMI Confirmed by Theda Belfastegeler, Chris ([id number]) on 10/18/2018 3:41:49 PM Also confirmed by Theda Belfastegeler, Chris ([id number]), editor Barbette HairCassel, Kerry [phone]([id number])  on 10/18/2018 3:45:07 PM   Radiology Dg Chest 2 View  Result Date: 10/18/2018 CLINICAL DATA:  Chest pressure and tightness. EXAM: CHEST - 2 VIEW COMPARISON:  Chest x-ray dated January 04, 2014. FINDINGS: The heart size and mediastinal contours are within normal limits. Both lungs are clear. The visualized skeletal structures are unremarkable. IMPRESSION: No active cardiopulmonary disease. Electronically Signed   By: Obie DredgeWilliam T Derry M.D.   On: 10/18/2018 15:55   Ct Angio Chest Pe W And/or Wo Contrast  Result Date: 10/18/2018 CLINICAL DATA:  Chest pressure and tightness. EXAM: CT ANGIOGRAPHY CHEST WITH CONTRAST TECHNIQUE: Multidetector CT imaging of the chest was performed using the standard protocol during bolus administration of intravenous contrast. Multiplanar CT image reconstructions and MIPs were obtained to evaluate the vascular anatomy. CONTRAST:  100mL ISOVUE-370 IOPAMIDOL (ISOVUE-370) INJECTION  76% COMPARISON:  None. FINDINGS: Cardiovascular: The heart size is normal. No substantial pericardial effusion. No thoracic aortic aneurysm. No filling defect within the opacified pulmonary arteries to suggest the presence of an acute pulmonary embolus. Mediastinum/Nodes: No mediastinal lymphadenopathy. No evidence for gross hilar lymphadenopathy although assessment is limited by the lack of intravenous contrast on today's study. The esophagus has normal imaging features. There is no axillary lymphadenopathy. Lungs/Pleura: The central tracheobronchial airways are patent. No focal airspace consolidation. No pulmonary edema or pleural effusion. Posterior atelectasis noted, right greater than left. Upper Abdomen: Left renal cyst incompletely visualized. Musculoskeletal: No worrisome lytic or sclerotic osseous abnormality. Review of the MIP images confirms the above findings. IMPRESSION: 1. No CT evidence for acute pulmonary embolus. 2. No acute findings in the chest. Electronically Signed   By: Kennith CenterEric  Mansell M.D.   On: 10/18/2018 21:06    Procedures Procedures (including critical care time)  Medications Ordered in ED Medications  sodium chloride (PF) 0.9 % injection (has no administration in time range)  iopamidol (ISOVUE-370) 76 % injection (has no administration in time range)  albuterol (PROVENTIL HFA;VENTOLIN HFA) 108 (90 Base) MCG/ACT inhaler 2 puff (has no administration in time range)  predniSONE (DELTASONE) tablet 60 mg (has no administration in time range)  ipratropium-albuterol (DUONEB) 0.5-2.5 (3) MG/3ML nebulizer solution 3 mL (3 mLs Nebulization Given 10/18/18 1818)  iopamidol (ISOVUE-370) 76 % injection 100 mL (100 mLs Intravenous Contrast Given 10/18/18 1926)     Initial Impression / Assessment and Plan / ED Course  I have reviewed the triage vital signs and the nursing notes.  Pertinent labs & imaging results that were available during my care of the patient were reviewed by me and  considered in my medical decision making (see chart for details).     Norman HerrlichCartilia T Baldini is a 38 y.o. female with no significant past medical history who presents with 1 day of chest tightness and pressure and 1 week of cough.  Patient reports that she has had some congestion URI symptoms and cough for the last week.  She is unsure of sick contacts but  does work at [employer] with numerous customers.  She reports the chest tightness is a pressure and tightness that is an 8 out of 10 in severity.  She reports it is worsened with deep breathing.  She denies it worsened with exertion.  She denies nausea vomiting, constipation, diarrhea.  She does report some urinary frequency.  She denies any abdominal pain flank pain or back pain.  No recent trauma.  He reports he is had some chills but no fevers.  She reports that she had some phlegm-like production of sputum.  On exam, lungs have wheezing.  Patient denies any history of asthma or COPD but does say that she has had allergies in the past.  She had abdomen was nontender, legs were not edematous.  Chest was slightly tender to palpation.  Oropharyngeal exam unremarkable.  Patient resting comfortably on arrival.  For the wheezing, patient was given a breathing treatment.  Suspect mild reactive airway disease that may have worsened in the setting of the URI.  Will x-ray her chest to look for pneumonia.  Will get labs to rule out a cardiac or PE cause of symptoms.   If patient remains stable on room air and is breathing better after breathing treatment and we rule out infection cardiac or pulmonary cause, anticipate discharge.  9:48 PM Patient's diagnostic work-up was returned.  Patient d-dimer was elevated and PE study was ordered.  PE study showed no bone embolism.  No other abnormality seen on chest CT.  Labs are reassuring.  Patient felt her breathing was somewhat better after breathing treatment.  Suspect mild reactive airway disease in the setting  of URI.  Patient given a dose and a prescription for a burst of steroids.  She will be given 2 puffs of inhaler and will take at home.  Patient follow-up with a PCP and understood strict return precautions.  Patient discharged in good condition with improved symptoms.   Final Clinical Impressions(s) / ED Diagnoses   Final diagnoses:  Upper respiratory tract infection, unspecified type  Wheezing    ED Discharge Orders         Ordered    predniSONE (DELTASONE) 50 MG tablet  Daily     10/18/18 2154          Clinical Impression: 1. Upper respiratory tract infection, unspecified type   2. Wheezing     Disposition: Discharge  Condition: Good  I have discussed the results, Dx and Tx plan with the pt(& family if present). He/she/they expressed understanding and agree(s) with the plan. Discharge instructions discussed at great length. Strict return precautions discussed and pt &/or family have verbalized understanding of the instructions. No further questions at time of discharge.    New Prescriptions   PREDNISONE (DELTASONE) 50 MG TABLET    Take 1 tablet (50 mg total) by mouth daily.    [facility] [address] [place] [phone] Schedule an appointment as soon as possible for a visit    [facility] [address] [id number] [place] [phone]       Tegeler, Canary Brimhristopher J, MD 10/18/18 2156

## 2018-10-18 NOTE — Discharge Instructions (Signed)
Your work-up today did not show evidence of a blood clot and your CT imaging was reassuring.  We suspect your wheezing was due to mild reactive airway disease exacerbated by the viral upper respiratory infection.  Please use the steroids for the next several days and use the inhaler we provided.  Please follow-up with your primary doctor for further management.  If any symptoms change or worsen, please return to the nearest emergency department.

## 2018-10-18 NOTE — ED Notes (Signed)
Pt verbalized discharge instructions and follow up care. Alert and ambulatory. Shown how to use an inhaler

## 2018-10-18 NOTE — ED Triage Notes (Signed)
Pt reports she has been sick and she was originally coughing up brown and it is now clear. Pt reports she feels chest pressure and tightness. Pt denies N/V/D.  Pt reports she has been short of breathe when she is walking

## 2018-10-20 LAB — URINE CULTURE

## 2019-02-16 ENCOUNTER — Other Ambulatory Visit: Payer: Self-pay | Admitting: Obstetrics & Gynecology

## 2019-03-31 ENCOUNTER — Other Ambulatory Visit: Payer: Self-pay | Admitting: Obstetrics and Gynecology

## 2020-06-22 IMAGING — CR DG CHEST 2V
2 series · 2 of 2 positions shown · non-contrast
Comparison: Chest x-ray dated January 04, 2014.

CLINICAL DATA: Chest pressure and tightness.

EXAM:
CHEST - 2 VIEW

[w chest pa]
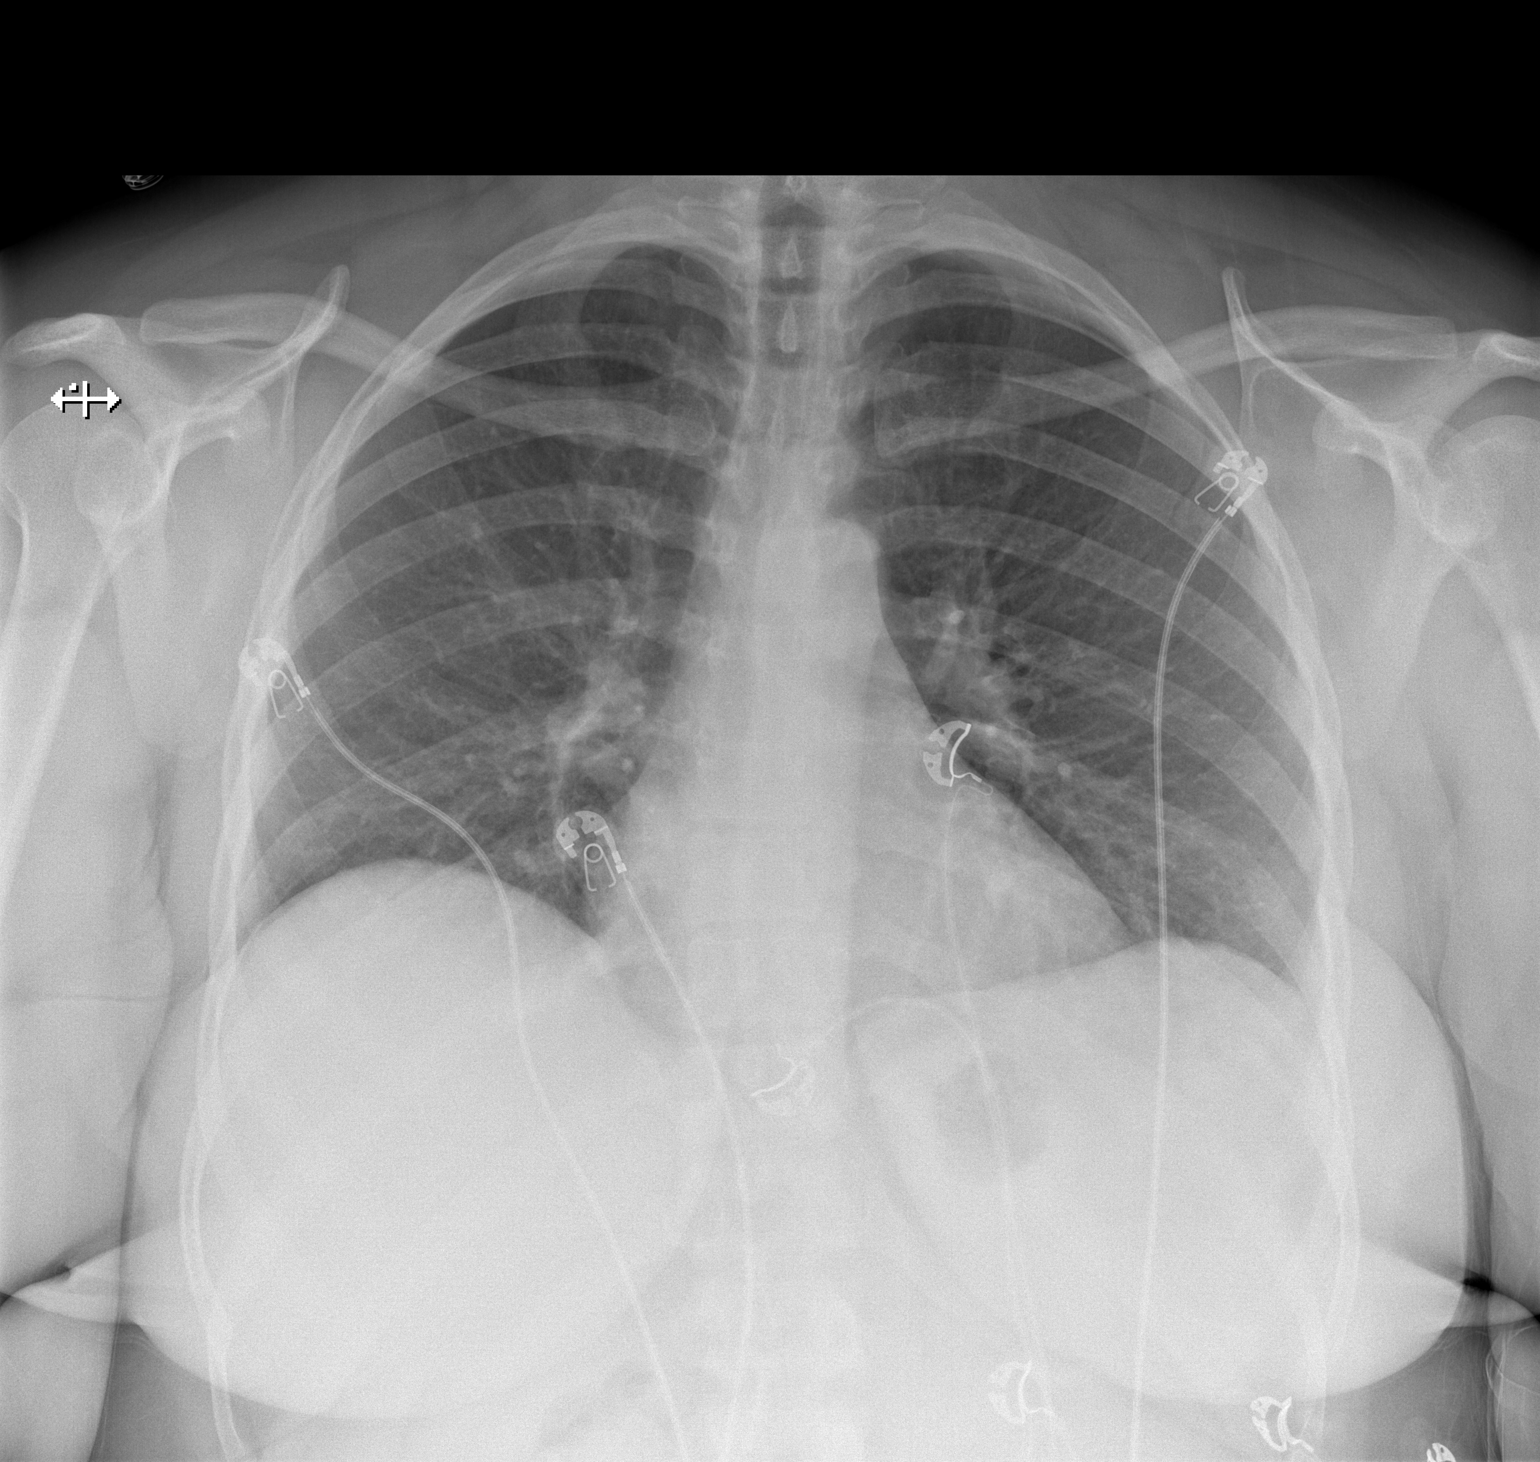

[w chest lat]
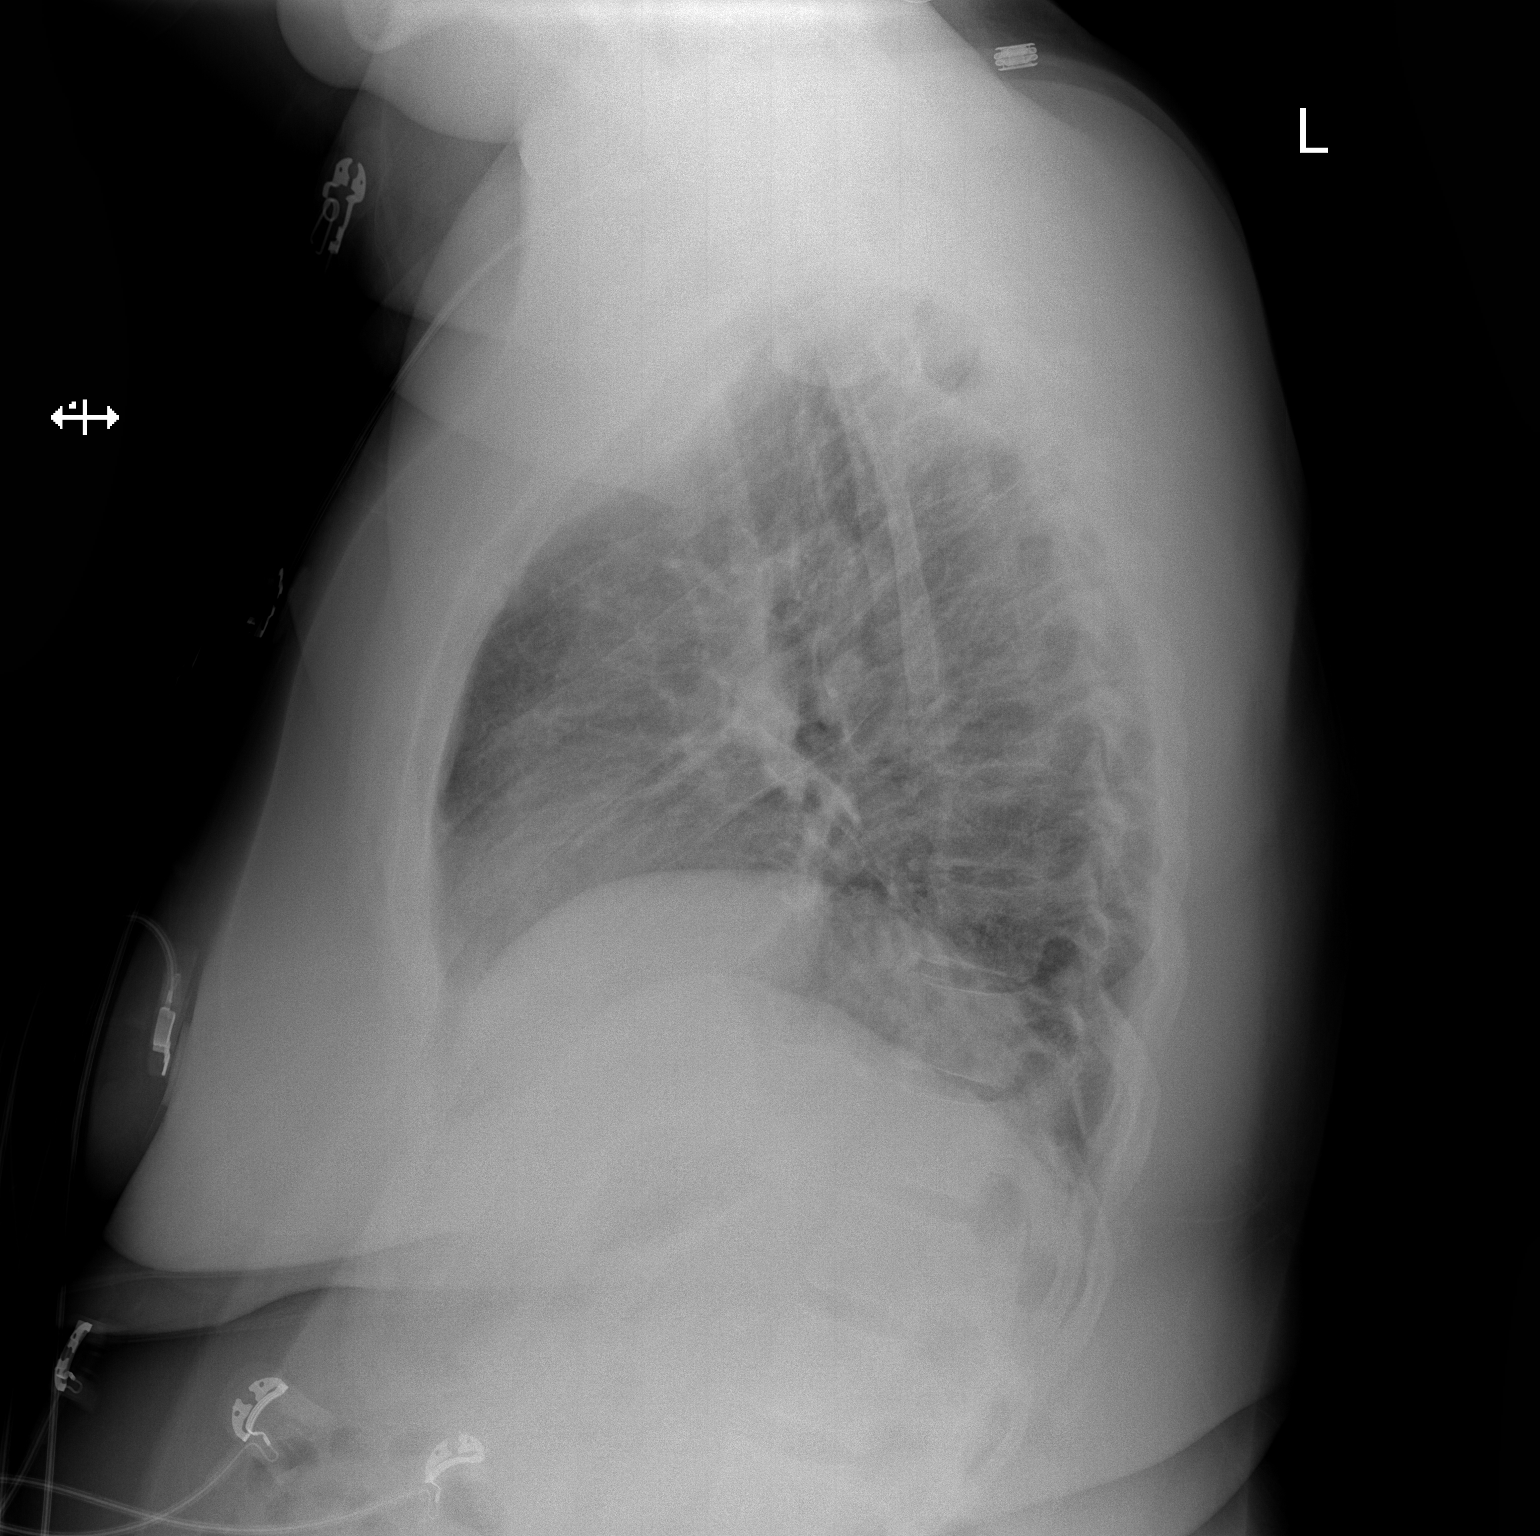

[2 of 2 positions shown; findings below may reference images not displayed]

FINDINGS: The heart size and mediastinal contours are within normal limits.
Both lungs are clear. The visualized skeletal structures are
unremarkable.
IMPRESSION: No active cardiopulmonary disease.

## 2021-11-27 ENCOUNTER — Other Ambulatory Visit (HOSPITAL_COMMUNITY): Payer: Self-pay | Admitting: Medical

## 2021-11-27 ENCOUNTER — Ambulatory Visit (HOSPITAL_COMMUNITY)
Admission: RE | Admit: 2021-11-27 | Discharge: 2021-11-27 | Disposition: A | Discharge status: Home / Self Care | Payer: Medicaid Other | Source: Ambulatory Visit | Attending: Cardiovascular Disease | Admitting: Cardiovascular Disease

## 2021-11-27 ENCOUNTER — Other Ambulatory Visit: Payer: Self-pay

## 2021-11-27 DIAGNOSIS — M79605 Pain in left leg: Secondary | ICD-10-CM

## 2023-02-02 ENCOUNTER — Ambulatory Visit (HOSPITAL_COMMUNITY)
Admission: EM | Admit: 2023-02-02 | Discharge: 2023-02-02 | Disposition: A | Discharge status: Home / Self Care | Payer: Medicaid Other | Attending: Internal Medicine | Admitting: Internal Medicine

## 2023-02-02 ENCOUNTER — Encounter (HOSPITAL_COMMUNITY): Payer: Self-pay

## 2023-02-02 DIAGNOSIS — R059 Cough, unspecified: Secondary | ICD-10-CM | POA: Insufficient documentation

## 2023-02-02 DIAGNOSIS — Z1152 Encounter for screening for COVID-19: Secondary | ICD-10-CM | POA: Insufficient documentation

## 2023-02-02 DIAGNOSIS — J069 Acute upper respiratory infection, unspecified: Secondary | ICD-10-CM | POA: Insufficient documentation

## 2023-02-02 MED ORDER — BENZONATATE 100 MG PO CAPS: 100.0000 mg | ORAL_CAPSULE | Freq: Three times a day (TID) | ORAL | 0 refills | Status: DC

## 2023-02-02 MED ORDER — CETIRIZINE HCL 10 MG PO TABS: 10.0000 mg | ORAL_TABLET | Freq: Every day | ORAL | 2 refills | Status: AC

## 2023-02-02 NOTE — ED Triage Notes (Signed)
Patient's sons are all sick. One son diagnosed with URI while her twins are being seen today. Patient having nasal congestion, runny nose, slight dry cough. Onset Friday afternoon.

## 2023-02-02 NOTE — Discharge Instructions (Signed)
You have a viral upper respiratory infection.   Use the following medicines to help with symptoms: -  Cetirizine once daily ongoing through allergy season to help dry up mucous.  - you may also purchase mucinex over the counter as needed for stuffy nose. - Tylenol 1,000mg  and/or ibuprofen 600mg  every 6 hours with food as needed for aches/pains or fever/chills.  - Tessalon perles every 8 hours as needed for cough.  1 tablespoon of honey in warm water and/or salt water gargles may also help with symptoms. Humidifier to your room will help add water to the air and reduce coughing.  If you develop any new or worsening symptoms, please return.  If your symptoms are severe, please go to the emergency room.  Follow-up with your primary care provider for further evaluation and management of your symptoms as well as ongoing wellness visits.  I hope you feel better!

## 2023-02-02 NOTE — ED Provider Notes (Signed)
[facility]    CSN: [id number] Arrival date & time: 02/02/23  1315      History   Chief Complaint Chief Complaint  Patient presents with   URI    HPI Rebecca Mccarthy is a 42 y.o. female.   Patient presents to urgent care for evaluation of rhinorrhea, cough, mild sore throat, and generalized fatigue since Friday, January 31, 2023.  She states her oldest son is sick with similar symptoms and was diagnosed with a viral upper respiratory infection at urgent care the day before her symptoms started.  She has not had a fever or chills at home.  Denies chest pain, shortness of breath, heart palpitations, nausea, vomiting, diarrhea, abdominal pain, urinary symptoms, rash, and dizziness.  No ear pain.  Reports intermittent headaches to the frontal aspect of the head.  Cannot identify any triggering or relieving factors for head pain.  Non-smoker, denies drug use.  Denies history of chronic respiratory problems.  Recent antibiotic or steroid use.  She has not attempted use of any over-the-counter medications to help with symptoms before coming to urgent care. Her husband is at home with significant medical conditions causing immunocompromised condition and bilateral pneumonia. She is concerned about bringing home viral illness to    URI   History reviewed. No pertinent past medical history.  There are no problems to display for this patient.   Past Surgical History:  Procedure Laterality Date   CESAREAN SECTION     KNEE SURGERY     LAPAROSCOPIC OVARIAN CYSTECTOMY      OB History     Gravida      Para      Term      Preterm      AB      Living  3      SAB      IAB      Ectopic      Multiple      Live Births               Home Medications    Prior to Admission medications   Medication Sig Start Date End Date Taking? Authorizing Provider  benzonatate (TESSALON) 100 MG capsule Take 1 capsule (100 mg total) by mouth every 8 (eight) hours. 02/02/23   Yes Carlisle Beers, FNP  cetirizine (ZYRTEC) 10 MG tablet Take 1 tablet (10 mg total) by mouth daily. 02/02/23  Yes Carlisle Beers, FNP  acetaminophen (TYLENOL) 325 MG tablet Take 1 tablet (325 mg total) by mouth every 6 (six) hours as needed. Patient not taking: Reported on 03/19/2015 12/10/14   Sciacca, Ashok Cordia, PA-C  cyclobenzaprine (FLEXERIL) 10 MG tablet Take 1 tablet (10 mg total) by mouth 2 (two) times daily as needed for muscle spasms. Patient not taking: Reported on 10/18/2018 09/08/17   Maczis, Elmer Sow, PA-C  diclofenac (VOLTAREN) 75 MG EC tablet Take 1 tablet (75 mg total) by mouth 2 (two) times daily. Patient not taking: Reported on 09/08/2017 10/24/16   Dorena Bodo, NP  ibuprofen (ADVIL,MOTRIN) 800 MG tablet Take 1 tablet (800 mg total) by mouth 3 (three) times daily. Patient not taking: Reported on 09/08/2017 07/01/15   Fayrene Helper, PA-C  ibuprofen (ADVIL,MOTRIN) 800 MG tablet Take 1 tablet (800 mg total) by mouth 3 (three) times daily. Patient not taking: Reported on 10/18/2018 03/14/16   Barrett, Rolm Gala, PA-C  medroxyPROGESTERone (DEPO-PROVERA) 150 MG/ML injection Inject 150 mg into the muscle every 3 (three) months.    [provider]  naproxen (NAPROSYN) 500 MG tablet Take 1 tablet (500 mg total) by mouth 2 (two) times daily as needed for moderate pain. Patient not taking: Reported on 09/08/2017 03/19/15   Loren Racer, MD  predniSONE (DELTASONE) 50 MG tablet Take 1 tablet (50 mg total) by mouth daily. 10/19/18   Tegeler, Canary Brim, MD  Specialty Vitamins Products (VITAMINS FOR HAIR PO) Take 1 tablet by mouth daily.    [provider]  traMADol (ULTRAM) 50 MG tablet Take 1 tablet (50 mg total) by mouth every 6 (six) hours as needed. Patient not taking: Reported on 03/19/2015 01/04/14   Dione Booze, MD    Family History History reviewed. No pertinent family history.  Social History Social History   Tobacco Use   Smoking status: Never    Smokeless tobacco: Never  Vaping Use   Vaping Use: Never used  Substance Use Topics   Alcohol use: No   Drug use: No     Allergies   Aspirin, Penicillins, and Shrimp [shellfish allergy]   Review of Systems Review of Systems Per HPI  Physical Exam Triage Vital Signs ED Triage Vitals  Enc Vitals Group     BP 02/02/23 1401 (!) 142/94     Pulse Rate 02/02/23 1401 75     Resp 02/02/23 1401 18     Temp --      Temp Source 02/02/23 1401 Oral     SpO2 02/02/23 1401 98 %     Weight --      Height --      Head Circumference --      Peak Flow --      Pain Score 02/02/23 1400 0     Pain Loc --      Pain Edu? --      Excl. in GC? --    No data found.  Updated Vital Signs BP (!) 142/94 (BP Location: Left Arm)   Pulse 75   Resp 18   LMP  (LMP Unknown)   SpO2 98%   Visual Acuity Right Eye Distance:   Left Eye Distance:   Bilateral Distance:    Right Eye Near:   Left Eye Near:    Bilateral Near:     Physical Exam Vitals and nursing note reviewed.  Constitutional:      Appearance: She is not ill-appearing or toxic-appearing.  HENT:     Head: Normocephalic and atraumatic.     Right Ear: Hearing, tympanic membrane, ear canal and external ear normal.     Left Ear: Hearing, tympanic membrane, ear canal and external ear normal.     Nose: Rhinorrhea present.     Mouth/Throat:     Lips: Pink.     Mouth: Mucous membranes are moist. No injury.     Tongue: No lesions. Tongue does not deviate from midline.     Palate: No mass and lesions.     Pharynx: Oropharynx is clear. Uvula midline. Posterior oropharyngeal erythema present. No pharyngeal swelling, oropharyngeal exudate or uvula swelling.     Tonsils: No tonsillar exudate or tonsillar abscesses.  Eyes:     General: Lids are normal. Vision grossly intact. Gaze aligned appropriately.        Right eye: No discharge.        Left eye: No discharge.     Extraocular Movements: Extraocular movements intact.      Conjunctiva/sclera: Conjunctivae normal.  Cardiovascular:     Rate and Rhythm: Normal rate and regular rhythm.  Heart sounds: Normal heart sounds, S1 normal and S2 normal.  Pulmonary:     Effort: Pulmonary effort is normal. No respiratory distress.     Breath sounds: Normal breath sounds and air entry. No stridor. No wheezing, rhonchi or rales.  Chest:     Chest wall: No tenderness.  Musculoskeletal:     Cervical back: Neck supple.     Right lower leg: No edema.     Left lower leg: No edema.  Lymphadenopathy:     Cervical: No cervical adenopathy.  Skin:    General: Skin is warm and dry.     Capillary Refill: Capillary refill takes less than 2 seconds.     Findings: No rash.  Neurological:     General: No focal deficit present.     Mental Status: She is alert and oriented to person, place, and time. Mental status is at baseline.     Cranial Nerves: Cranial nerves 2-12 are intact. No dysarthria or facial asymmetry.     Sensory: Sensation is intact.     Motor: Motor function is intact.     Coordination: Coordination is intact.     Gait: Gait is intact.  Psychiatric:        Mood and Affect: Mood normal.        Speech: Speech normal.        Behavior: Behavior normal.        Thought Content: Thought content normal.        Judgment: Judgment normal.      UC Treatments / Results  Labs (all labs ordered are listed, but only abnormal results are displayed) Labs Reviewed  SARS CORONAVIRUS 2 (TAT 6-24 HRS)    EKG   Radiology No results found.  Procedures Procedures (including critical care time)  Medications Ordered in UC Medications - No data to display  Initial Impression / Assessment and Plan / UC Course  I have reviewed the triage vital signs and the nursing notes.  Pertinent labs & imaging results that were available during my care of the patient were reviewed by me and considered in my medical decision making (see chart for details).   1. Viral URI with  cough Symptoms and physical exam consistent with a viral upper respiratory tract infection that will likely resolve with rest, fluids, and prescriptions for symptomatic relief. Deferred imaging based on stable cardiopulmonary exam and hemodynamically stable vital signs. COVID-19 testing is pending.  We will call patient if this is positive.  Quarantine guidelines discussed. Currently on day 3 of symptoms.   Tessalon perles sent to pharmacy for symptomatic relief to be taken as prescribed.  May continue taking over the counter medications as directed for further symptomatic relief.  Nonpharmacologic interventions for symptom relief provided and after visit summary below. Advised to push fluids to stay well hydrated while recovering from viral illness.   Discussed physical exam and available lab work findings in clinic with patient.  Counseled patient regarding appropriate use of medications and potential side effects for all medications recommended or prescribed today. Discussed red flag signs and symptoms of worsening condition,when to call the PCP office, return to urgent care, and when to seek higher level of care in the emergency department. Patient verbalizes understanding and agreement with plan. All questions answered. Patient discharged in stable condition.   Final Clinical Impressions(s) / UC Diagnoses   Final diagnoses:  Viral URI with cough     Discharge Instructions      You have a  viral upper respiratory infection.   Use the following medicines to help with symptoms: -  Cetirizine once daily ongoing through allergy season to help dry up mucous.  - you may also purchase mucinex over the counter as needed for stuffy nose. - Tylenol 1,000mg  and/or ibuprofen 600mg  every 6 hours with food as needed for aches/pains or fever/chills.  - Tessalon perles every 8 hours as needed for cough.  1 tablespoon of honey in warm water and/or salt water gargles may also help with symptoms.  Humidifier to your room will help add water to the air and reduce coughing.  If you develop any new or worsening symptoms, please return.  If your symptoms are severe, please go to the emergency room.  Follow-up with your primary care provider for further evaluation and management of your symptoms as well as ongoing wellness visits.  I hope you feel better!    ED Prescriptions     Medication Sig Dispense Auth. Provider   benzonatate (TESSALON) 100 MG capsule Take 1 capsule (100 mg total) by mouth every 8 (eight) hours. 21 capsule Carlisle Beers, FNP   cetirizine (ZYRTEC) 10 MG tablet Take 1 tablet (10 mg total) by mouth daily. 30 tablet Carlisle Beers, FNP      PDMP not reviewed this encounter.   Carlisle Beers, [place] 02/02/23 [phone]

## 2023-02-03 LAB — SARS CORONAVIRUS 2 (TAT 6-24 HRS): SARS Coronavirus 2: NEGATIVE

## 2023-02-23 ENCOUNTER — Telehealth (HOSPITAL_COMMUNITY): Payer: Self-pay | Admitting: Emergency Medicine

## 2023-02-23 ENCOUNTER — Encounter (HOSPITAL_COMMUNITY): Payer: Self-pay

## 2023-02-23 ENCOUNTER — Ambulatory Visit (HOSPITAL_COMMUNITY)
Admission: EM | Admit: 2023-02-23 | Discharge: 2023-02-23 | Disposition: A | Discharge status: Home / Self Care | Payer: Medicaid Other | Attending: Emergency Medicine | Admitting: Emergency Medicine

## 2023-02-23 ENCOUNTER — Ambulatory Visit (INDEPENDENT_AMBULATORY_CARE_PROVIDER_SITE_OTHER): Payer: Medicaid Other

## 2023-02-23 DIAGNOSIS — J22 Unspecified acute lower respiratory infection: Secondary | ICD-10-CM

## 2023-02-23 DIAGNOSIS — J329 Chronic sinusitis, unspecified: Secondary | ICD-10-CM

## 2023-02-23 DIAGNOSIS — J0100 Acute maxillary sinusitis, unspecified: Secondary | ICD-10-CM | POA: Diagnosis not present

## 2023-02-23 DIAGNOSIS — J302 Other seasonal allergic rhinitis: Secondary | ICD-10-CM | POA: Diagnosis not present

## 2023-02-23 MED ORDER — METHYLPREDNISOLONE 8 MG PO TABS: 8.0000 mg | ORAL_TABLET | Freq: Every day | ORAL | 0 refills | Status: AC

## 2023-02-23 MED ORDER — AZELASTINE-FLUTICASONE 137-50 MCG/ACT NA SUSP: 1.0000 | Freq: Two times a day (BID) | NASAL | 2 refills | Status: DC

## 2023-02-23 MED ORDER — DYMISTA 137-50 MCG/ACT NA SUSP: 1.0000 | Freq: Two times a day (BID) | NASAL | 2 refills | Status: DC

## 2023-02-23 MED ORDER — AZITHROMYCIN 250 MG PO TABS: 500.0000 mg | ORAL_TABLET | Freq: Every day | ORAL | 0 refills | Status: AC

## 2023-02-23 NOTE — ED Triage Notes (Signed)
Patient reports she is not better. Reports sore throat, chest congestion and hoarse voice. Pt is taking Zyrtec.

## 2023-02-23 NOTE — Discharge Instructions (Signed)
Your chest x-ray today was not concerning for pneumonia.  I do believe that you are still suffering from a bacterial infection in your lower respiratory tract because you are coughing up green sputum, feeling more tired than usual and your symptoms have been going on for greater than 10 days overall.  Your symptoms and my physical exam findings are also concerning for exacerbation of your underlying allergies that normally do not bother you that are causing your respiratory infection symptoms to persist.  Please read below to learn more about the medications, dosages and frequencies that I recommend to help alleviate your symptoms and to get you feeling better soon:  Z-Pak (azithromycin):  Please take two (2) tablets twice daily for 3 days.  This antibiotic will eliminate any bacteria in your lungs that is causing you to cough up green sputum as well as any bacteria in your sinuses that are causing persistent symptoms of congestion and mucus production.  Please keep in mind that antibiotics can cause upset stomach, this will resolve once antibiotics are complete.  You are welcome to take a probiotic, eat yogurt, take Imodium while taking this medication.  Please avoid other systemic medications such as Maalox, Pepto-Bismol or milk of magnesia as they can interfere with the body's ability to absorb the antibiotics.  Medrol (methylprednisolone): This is a steroid that will significantly calm your upper and lower airways.  Please take the daily recommended quantity of tablets daily with your breakfast meal starting tomorrow morning until the prescription is complete.      Zyrtec (cetirizine): This is an excellent second-generation antihistamine that helps to reduce respiratory inflammatory response to environmental allergens.  In some patients, this medication can cause daytime sleepiness so I recommend that you take 1 tablet daily at bedtime.  Please continue taking this daily and keep in mind that you have  refills available at your pharmacy should you run out.  I recommend that you continue Zyrtec as well as Dymista throughout the end of June.  Dymista (fluticasone and azelastine): This is a combination nasal spray that contains both a nasal steroid and nasal antihistamine.  Please use 1 spray in each nare twice daily or every 12 hours.  This medication works best when it is used regularly, not "as needed".  You may find that it takes a few days for this to reach full effectiveness, please be patient with his onboarding process.  The most common side effect of this medication is nosebleeds.  Please discontinue use for 1 week if this occurs, then resume.   If symptoms have not meaningfully improved in the next 3 to 5 days, please return for repeat evaluation or follow-up with your regular provider.  If symptoms have worsened in the next 3 to 5 days, please return for repeat evaluation or follow-up with your regular provider.    Thank you for visiting urgent care today.  We appreciate the opportunity to participate in your care.

## 2023-02-23 NOTE — Telephone Encounter (Signed)
Pharmacy did not fill prescription as sent, started PA instead.  Pharmacy advised that Dymista is preferred on [place] Medicaid formulary.  Prescription resent.

## 2023-02-23 NOTE — ED Provider Notes (Signed)
[facility]    CSN: [id number] Arrival date & time: 02/23/23  1041    HISTORY   Chief Complaint  Patient presents with   Nasal Congestion   Cough   HPI Rebecca Mccarthy is a pleasant, 42 y.o. female who presents to urgent care today. Patient reports she is not better. Reports sore throat, chest congestion and hoarse voice. Pt is taking Zyrtec.   The history is provided by the patient.   History reviewed. No pertinent past medical history. There are no problems to display for this patient.  Past Surgical History:  Procedure Laterality Date   CESAREAN SECTION     KNEE SURGERY     LAPAROSCOPIC OVARIAN CYSTECTOMY     OB History     Gravida      Para      Term      Preterm      AB      Living  3      SAB      IAB      Ectopic      Multiple      Live Births             Home Medications    Prior to Admission medications   Medication Sig Start Date End Date Taking? Authorizing Provider  acetaminophen (TYLENOL) 325 MG tablet Take 1 tablet (325 mg total) by mouth every 6 (six) hours as needed. Patient not taking: Reported on 03/19/2015 12/10/14   Sciacca, Ashok Cordia, PA-C  benzonatate (TESSALON) 100 MG capsule Take 1 capsule (100 mg total) by mouth every 8 (eight) hours. 02/02/23   Carlisle Beers, FNP  cetirizine (ZYRTEC) 10 MG tablet Take 1 tablet (10 mg total) by mouth daily. 02/02/23   Carlisle Beers, FNP  cyclobenzaprine (FLEXERIL) 10 MG tablet Take 1 tablet (10 mg total) by mouth 2 (two) times daily as needed for muscle spasms. Patient not taking: Reported on 10/18/2018 09/08/17   Maczis, Elmer Sow, PA-C  diclofenac (VOLTAREN) 75 MG EC tablet Take 1 tablet (75 mg total) by mouth 2 (two) times daily. Patient not taking: Reported on 09/08/2017 10/24/16   Dorena Bodo, NP  ibuprofen (ADVIL,MOTRIN) 800 MG tablet Take 1 tablet (800 mg total) by mouth 3 (three) times daily. Patient not taking: Reported on 09/08/2017 07/01/15   Fayrene Helper, PA-C  ibuprofen (ADVIL,MOTRIN) 800 MG tablet Take 1 tablet (800 mg total) by mouth 3 (three) times daily. Patient not taking: Reported on 10/18/2018 03/14/16   Barrett, Rolm Gala, PA-C  medroxyPROGESTERone (DEPO-PROVERA) 150 MG/ML injection Inject 150 mg into the muscle every 3 (three) months.    [provider]  naproxen (NAPROSYN) 500 MG tablet Take 1 tablet (500 mg total) by mouth 2 (two) times daily as needed for moderate pain. Patient not taking: Reported on 09/08/2017 03/19/15   Loren Racer, MD  predniSONE (DELTASONE) 50 MG tablet Take 1 tablet (50 mg total) by mouth daily. 10/19/18   Tegeler, Canary Brim, MD  Specialty Vitamins Products (VITAMINS FOR HAIR PO) Take 1 tablet by mouth daily.    [provider]  traMADol (ULTRAM) 50 MG tablet Take 1 tablet (50 mg total) by mouth every 6 (six) hours as needed. Patient not taking: Reported on 03/19/2015 01/04/14   Dione Booze, MD    Family History History reviewed. No pertinent family history. Social History Social History   Tobacco Use   Smoking status: Never   Smokeless tobacco: Never  Vaping Use  Vaping Use: Never used  Substance Use Topics   Alcohol use: No   Drug use: No   Allergies   Aspirin, Penicillins, and Shrimp [shellfish allergy]  Review of Systems Review of Systems Pertinent findings revealed after performing a 14 point review of systems has been noted in the history of present illness.  Physical Exam Vital Signs BP (!) 140/94 (BP Location: Left Arm)   Pulse 74   Temp 98.5 F (36.9 C) (Oral)   Resp 16   LMP  (LMP Unknown)   SpO2 98%   No data found.  Physical Exam Vitals and nursing note reviewed.  Constitutional:      General: She is not in acute distress.    Appearance: Normal appearance. She is not ill-appearing.  HENT:     Head: Normocephalic and atraumatic.     Salivary Glands: Right salivary gland is diffusely enlarged and tender. Left salivary gland is diffusely enlarged  and tender.     Right Ear: Ear canal and external ear normal. No drainage. A middle ear effusion is present. There is no impacted cerumen. Tympanic membrane is bulging. Tympanic membrane is not injected or erythematous.     Left Ear: Ear canal and external ear normal. No drainage. A middle ear effusion is present. There is no impacted cerumen. Tympanic membrane is bulging. Tympanic membrane is not injected or erythematous.     Ears:     Comments: Bilateral EACs normal, both TMs bulging with clear fluid    Nose: Rhinorrhea present. No nasal deformity, septal deviation, signs of injury, nasal tenderness, mucosal edema or congestion. Rhinorrhea is clear and purulent.     Right Nostril: Occlusion present. No foreign body, epistaxis or septal hematoma.     Left Nostril: No foreign body, epistaxis, septal hematoma or occlusion.     Right Turbinates: Enlarged, swollen and pale.     Left Turbinates: Enlarged, swollen and pale.     Right Sinus: No maxillary sinus tenderness or frontal sinus tenderness.     Left Sinus: No maxillary sinus tenderness or frontal sinus tenderness.     Mouth/Throat:     Lips: Pink. No lesions.     Mouth: Mucous membranes are moist. No oral lesions.     Pharynx: Oropharynx is clear. Uvula midline. No pharyngeal swelling, oropharyngeal exudate, posterior oropharyngeal erythema or uvula swelling.     Tonsils: No tonsillar exudate. 0 on the right. 0 on the left.     Comments: Postnasal drip, cobblestoning of posterior pharynx Eyes:     General: Lids are normal.        Right eye: No discharge.        Left eye: No discharge.     Extraocular Movements: Extraocular movements intact.     Conjunctiva/sclera:     Right eye: Right conjunctiva is injected. No chemosis or exudate.    Left eye: Left conjunctiva is injected. No chemosis or exudate.    Pupils: Pupils are equal, round, and reactive to light.  Neck:     Trachea: Trachea and phonation normal.  Cardiovascular:     Rate  and Rhythm: Normal rate and regular rhythm.     Pulses: Normal pulses.     Heart sounds: Normal heart sounds, S1 normal and S2 normal. No murmur heard.    No friction rub. No gallop.  Pulmonary:     Effort: Pulmonary effort is normal. No tachypnea, bradypnea, accessory muscle usage, prolonged expiration or respiratory distress.     Breath sounds:  No stridor, decreased air movement or transmitted upper airway sounds. Examination of the right-lower field reveals decreased breath sounds. Decreased breath sounds present. No wheezing, rhonchi or rales.  Chest:     Chest wall: No tenderness.  Musculoskeletal:        General: Normal range of motion.     Cervical back: Normal range of motion and neck supple. Normal range of motion.  Lymphadenopathy:     Cervical: Cervical adenopathy present.     Right cervical: Superficial cervical adenopathy and posterior cervical adenopathy present.     Left cervical: Superficial cervical adenopathy and posterior cervical adenopathy present.  Skin:    General: Skin is warm and dry.     Findings: No erythema or rash.  Neurological:     General: No focal deficit present.     Mental Status: She is alert and oriented to person, place, and time.  Psychiatric:        Mood and Affect: Mood normal.        Behavior: Behavior normal.     Visual Acuity Right Eye Distance:   Left Eye Distance:   Bilateral Distance:    Right Eye Near:   Left Eye Near:    Bilateral Near:     UC Couse / Diagnostics / Procedures:     Radiology DG Chest 2 View  Result Date: 02/23/2023 CLINICAL DATA:  42 year old female with history of productive cough with green sputum for 1 week. EXAM: CHEST - 2 VIEW COMPARISON:  Chest x-ray 10/18/2018. FINDINGS: Lung volumes are normal. No consolidative airspace disease. No pleural effusions. No pneumothorax. No pulmonary nodule or mass noted. Pulmonary vasculature and the cardiomediastinal silhouette are within normal limits. IMPRESSION: No  radiographic evidence of acute cardiopulmonary disease. Electronically Signed   By: Trudie Reed M.D.   On: 02/23/2023 13:03    Procedures Procedures (including critical care time) EKG  Pending results:  Labs Reviewed - No data to display  Medications Ordered in UC: Medications - No data to display  UC Diagnoses / Final Clinical Impressions(s)   I have reviewed the triage vital signs and the nursing notes.  Pertinent labs & imaging results that were available during my care of the patient were reviewed by me and considered in my medical decision making (see chart for details).    Final diagnoses:  Lower respiratory tract infection  Acute maxillary sinusitis, recurrence not specified  Rhinosinusitis  Seasonal allergies   ***  Please see discharge instructions below for details of plan of care as provided to patient. ED Prescriptions     Medication Sig Dispense Auth. Provider   Azelastine-Fluticasone (DYMISTA) 137-50 MCG/ACT SUSP Place 1 spray into the nose every 12 (twelve) hours. 23 g Theadora Rama Scales, PA-C   methylPREDNISolone (MEDROL) 8 MG tablet Take 1 tablet (8 mg total) by mouth daily for 3 days. 3 tablet Theadora Rama Scales, PA-C   azithromycin (ZITHROMAX) 250 MG tablet Take 2 tablets (500 mg total) by mouth daily for 3 days. 6 tablet Theadora Rama Scales, PA-C      PDMP not reviewed this encounter.  Pending results:  Labs Reviewed - No data to display  Discharge Instructions:   Discharge Instructions      Your chest x-ray today was not concerning for pneumonia.  I do believe that you are still suffering from a bacterial infection in your lower respiratory tract because you are coughing up green sputum, feeling more tired than usual and your symptoms have been going on  for greater than 10 days overall.  Your symptoms and my physical exam findings are also concerning for exacerbation of your underlying allergies that normally do not bother you that  are causing your respiratory infection symptoms to persist.  Please read below to learn more about the medications, dosages and frequencies that I recommend to help alleviate your symptoms and to get you feeling better soon:  Z-Pak (azithromycin):  Please take two (2) tablets twice daily for 3 days.  This antibiotic will eliminate any bacteria in your lungs that is causing you to cough up green sputum as well as any bacteria in your sinuses that are causing persistent symptoms of congestion and mucus production.  Please keep in mind that antibiotics can cause upset stomach, this will resolve once antibiotics are complete.  You are welcome to take a probiotic, eat yogurt, take Imodium while taking this medication.  Please avoid other systemic medications such as Maalox, Pepto-Bismol or milk of magnesia as they can interfere with the body's ability to absorb the antibiotics.  Medrol (methylprednisolone): This is a steroid that will significantly calm your upper and lower airways.  Please take the daily recommended quantity of tablets daily with your breakfast meal starting tomorrow morning until the prescription is complete.      Zyrtec (cetirizine): This is an excellent second-generation antihistamine that helps to reduce respiratory inflammatory response to environmental allergens.  In some patients, this medication can cause daytime sleepiness so I recommend that you take 1 tablet daily at bedtime.  Please continue taking this daily and keep in mind that you have refills available at your pharmacy should you run out.  I recommend that you continue Zyrtec as well as Dymista throughout the end of June.  Dymista (fluticasone and azelastine): This is a combination nasal spray that contains both a nasal steroid and nasal antihistamine.  Please use 1 spray in each nare twice daily or every 12 hours.  This medication works best when it is used regularly, not "as needed".  You may find that it takes a few days  for this to reach full effectiveness, please be patient with his onboarding process.  The most common side effect of this medication is nosebleeds.  Please discontinue use for 1 week if this occurs, then resume.   If symptoms have not meaningfully improved in the next 3 to 5 days, please return for repeat evaluation or follow-up with your regular provider.  If symptoms have worsened in the next 3 to 5 days, please return for repeat evaluation or follow-up with your regular provider.    Thank you for visiting urgent care today.  We appreciate the opportunity to participate in your care.         Disposition Upon Discharge:  Condition: stable for discharge home  Patient presented with an acute illness with associated systemic symptoms and significant discomfort requiring urgent management. In my opinion, this is a condition that a prudent lay person (someone who possesses an average knowledge of health and medicine) may potentially expect to result in complications if not addressed urgently such as respiratory distress, impairment of bodily function or dysfunction of bodily organs.   Routine symptom specific, illness specific and/or disease specific instructions were discussed with the patient and/or caregiver at length.   As such, the patient has been evaluated and assessed, work-up was performed and treatment was provided in alignment with urgent care protocols and evidence based medicine.  Patient/parent/caregiver has been advised that the patient may require follow  up for further testing and treatment if the symptoms continue in spite of treatment, as clinically indicated and appropriate.  Patient/parent/caregiver has been advised to return to the [facility] or PCP if no better; to PCP or the Emergency Department if new signs and symptoms develop, or if the current signs or symptoms continue to change or worsen for further workup, evaluation and treatment as clinically indicated and  appropriate  The patient will follow up with their current PCP if and as advised. If the patient does not currently have a PCP we will assist them in obtaining one.   The patient may need specialty follow up if the symptoms continue, in spite of conservative treatment and management, for further workup, evaluation, consultation and treatment as clinically indicated and appropriate.  Patient/parent/caregiver verbalized understanding and agreement of plan as discussed.  All questions were addressed during visit.  Please see discharge instructions below for further details of plan.  This office note has been dictated using Teaching laboratory technician.  Unfortunately, this method of dictation can sometimes lead to typographical or grammatical errors.  I apologize for your inconvenience in advance if this occurs.  Please do not hesitate to reach out to me if clarification is needed.

## 2023-09-15 ENCOUNTER — Ambulatory Visit
Admission: RE | Admit: 2023-09-15 | Discharge: 2023-09-15 | Disposition: A | Discharge status: Home / Self Care | Payer: Medicaid Other | Source: Ambulatory Visit | Attending: Internal Medicine | Admitting: Internal Medicine

## 2023-09-15 VITALS — BP 138/82 | HR 81 | Temp 98.5°F | Resp 18

## 2023-09-15 DIAGNOSIS — R030 Elevated blood-pressure reading, without diagnosis of hypertension: Secondary | ICD-10-CM

## 2023-09-15 DIAGNOSIS — B002 Herpesviral gingivostomatitis and pharyngotonsillitis: Secondary | ICD-10-CM

## 2023-09-15 MED ORDER — ACYCLOVIR 400 MG PO TABS: 400.0000 mg | ORAL_TABLET | Freq: Three times a day (TID) | ORAL | 0 refills | Status: AC

## 2023-09-15 NOTE — ED Triage Notes (Signed)
Pt assumed she had fever 2 days ago, as she has a "fever blister" in the upper lip x 2 days.

## 2023-09-15 NOTE — Discharge Instructions (Signed)
Start acyclovir 3 times a day for 7 days.  Please follow-up with your PCP if your symptoms do not improve.  Please go to the ER for any worsening symptoms.  Hope you feel better soon!

## 2023-09-15 NOTE — ED Provider Notes (Signed)
[facility]    CSN: [id number] Arrival date & time: 09/15/23  3875      History   Chief Complaint Chief Complaint  Patient presents with   Fever    Entered by patient   Appointment    0930    HPI Rebecca Mccarthy is a 42 y.o. female presents for fever blister.  Patient reports 2 days of painful blisters on her left upper lip.  States she has had this in the past but has been several years.  Reports she feels "under the weather" but denies any URI symptoms, fevers, sore throat.  No OTC medications have been used since onset.  No other concerns at this time.   Fever   No past medical history on file.  There are no problems to display for this patient.   Past Surgical History:  Procedure Laterality Date   CESAREAN SECTION     KNEE SURGERY     LAPAROSCOPIC OVARIAN CYSTECTOMY      OB History     Gravida      Para      Term      Preterm      AB      Living  3      SAB      IAB      Ectopic      Multiple      Live Births               Home Medications    Prior to Admission medications   Medication Sig Start Date End Date Taking? Authorizing Provider  acetaminophen (TYLENOL) 500 MG tablet Take 500 mg by mouth every 6 (six) hours as needed.   Yes [provider]  acyclovir (ZOVIRAX) 400 MG tablet Take 1 tablet (400 mg total) by mouth 3 (three) times daily for 7 days. 09/15/23 09/22/23 Yes Radford Pax, NP  medroxyPROGESTERone (DEPO-PROVERA) 150 MG/ML injection Inject 150 mg into the muscle every 3 (three) months.   Yes [provider]  cetirizine (ZYRTEC) 10 MG tablet Take 1 tablet (10 mg total) by mouth daily. 02/02/23   Carlisle Beers, FNP  DYMISTA [phone] MCG/ACT SUSP Place 1 spray into the nose every 12 (twelve) hours. 02/23/23 03/25/23  Theadora Rama Scales, PA-C    Family History No family history on file.  Social History Social History   Tobacco Use   Smoking status: Never   Smokeless tobacco:  Never  Vaping Use   Vaping status: Never Used  Substance Use Topics   Alcohol use: No   Drug use: No     Allergies   Aspirin, Penicillins, and Shrimp [shellfish allergy]   Review of Systems Review of Systems  HENT:         Fever blisters     Physical Exam Triage Vital Signs ED Triage Vitals  Encounter Vitals Group     BP 09/15/23 1000 (!) 154/101     Systolic BP Percentile --      Diastolic BP Percentile --      Pulse Rate 09/15/23 1000 81     Resp 09/15/23 1000 18     Temp 09/15/23 1000 98.5 F (36.9 C)     Temp Source 09/15/23 1000 Oral     SpO2 09/15/23 1000 96 %     Weight --      Height --      Head Circumference --      Peak Flow --  Pain Score 09/15/23 1004 0     Pain Loc --      Pain Education --      Exclude from Growth Chart --    No data found.  Updated Vital Signs BP 138/82 (BP Location: Right Arm)   Pulse 81   Temp 98.5 F (36.9 C) (Oral)   Resp 18   SpO2 96%   Visual Acuity Right Eye Distance:   Left Eye Distance:   Bilateral Distance:    Right Eye Near:   Left Eye Near:    Bilateral Near:     Physical Exam Vitals and nursing note reviewed.  Constitutional:      General: She is not in acute distress.    Appearance: Normal appearance. She is not ill-appearing.  HENT:     Head: Normocephalic and atraumatic.     Mouth/Throat:      Comments: Cluster of vesicles to right upper lip. Eyes:     Pupils: Pupils are equal, round, and reactive to light.  Cardiovascular:     Rate and Rhythm: Normal rate.  Pulmonary:     Effort: Pulmonary effort is normal.  Skin:    General: Skin is warm and dry.  Neurological:     General: No focal deficit present.     Mental Status: She is alert and oriented to person, place, and time.  Psychiatric:        Mood and Affect: Mood normal.        Behavior: Behavior normal.      UC Treatments / Results  Labs (all labs ordered are listed, but only abnormal results are displayed) Labs Reviewed  - No data to display  EKG   Radiology No results found.  Procedures Procedures (including critical care time)  Medications Ordered in UC Medications - No data to display  Initial Impression / Assessment and Plan / UC Course  I have reviewed the triage vital signs and the nursing notes.  Pertinent labs & imaging results that were available during my care of the patient were reviewed by me and considered in my medical decision making (see chart for details).     Reviewed exam and symptoms with patient.  No red flags.  Start acyclovir for HSV 1 outbreak.  Patient blood pressure elevated on intake.  Patient denies history of hypertension but does endorse family history. Recheck of BP improved, advised to monitor. PCP follow up if symptoms do not improve. ER precautions reviewed.  Final Clinical Impressions(s) / UC Diagnoses   Final diagnoses:  Oral herpes simplex infection  Elevated BP without diagnosis of hypertension     Discharge Instructions      Start acyclovir 3 times a day for 7 days.  Please follow-up with your PCP if your symptoms do not improve.  Please go to the ER for any worsening symptoms.  Hope you feel better soon!     ED Prescriptions     Medication Sig Dispense Auth. Provider   acyclovir (ZOVIRAX) 400 MG tablet Take 1 tablet (400 mg total) by mouth 3 (three) times daily for 7 days. 21 tablet Radford Pax, NP      PDMP not reviewed this encounter.   Radford Pax, NP 09/15/23 1037

## 2023-11-10 ENCOUNTER — Ambulatory Visit
Admission: RE | Admit: 2023-11-10 | Discharge: 2023-11-10 | Disposition: A | Discharge status: Home / Self Care | Payer: Medicaid Other | Source: Ambulatory Visit | Attending: Family Medicine | Admitting: Family Medicine

## 2023-11-10 VITALS — BP 130/95 | HR 108 | Temp 98.5°F | Resp 18

## 2023-11-10 DIAGNOSIS — R051 Acute cough: Secondary | ICD-10-CM | POA: Diagnosis not present

## 2023-11-10 DIAGNOSIS — B349 Viral infection, unspecified: Secondary | ICD-10-CM

## 2023-11-10 LAB — POC COVID19/FLU A&B COMBO
Covid Antigen, POC: NEGATIVE
Influenza A Antigen, POC: NEGATIVE
Influenza B Antigen, POC: NEGATIVE

## 2023-11-10 NOTE — ED Triage Notes (Signed)
Pt presents with cough. On 1/26 she started with a runny nose and started to feel better. Pt states on Friday she developed a cough, headache, and body aches.

## 2023-11-10 NOTE — Discharge Instructions (Addendum)
 Supportive Care Medications  These are medications that may help with your symptoms. However, Please note that if your PCP has told you not to use certain products please follow his/her recommendations.  For example: - if you have high BP you should use something similar to Coricidin HPB, not Sudafed or antihistamines with a "D" such as Allegra D. The "D" means decongestant.  (Afrin is safe to use with HBP, but do not use longer than 3 days) -Higher Doses of NSAIDS (Ibuprofen, Aleve etc) with Kidney disease or poorly controlled BP.  Fever, Body aches, Headache  Ibuprofen 200 mg 2 tablets and Acetaminophen 2 tabs 4 times a day just before meals and at bedtime (every 6 hours)    Do not use NSAIDS if you are allergic to NSAIDS, if you have been given oral steroids-prednisone, methylprednisolone, dexamethasone until your steroids are finished, if you are pregnant or breast feeding, or if you have history of kidney or liver disease.   Sore Throat: Cepacol throat lozenges or spray  Cough Drops Warm salt water gargles  How to make saltwater rinses Use warm water, because warmth is more relieving to a sore throat than cold water. Warm water will also help the salt dissolve into the water more effectively. Use any type of salt you have available. Most saltwater rinse recipes call for 8 ounces of warm water and 1 teaspoon of salt. However, if your mouth is tender and the saltwater rinse stings, decrease the salt to a 1/2 teaspoon for the first 1 to 2 days. Bring water to a boil, then remove from heat, add salt, and stir. Let the saltwater cool to a warm temperature before rinsing with it. Once you have finished your rinse, discard leftover solution to avoid contamination.  Nasal Congestion: Afrin (Oxymetazoline) 1 squirt in each side of nose 2 times daily for 3 days only.  Oral decongestants like sudafed but only if you do not have high Blood Pressure if you have high Blood Pressure take CORICIDIN  HBP Antihistamines Nasal Saline/Neti Pot  Cough: Expectorants (guaifenesin) help thin mucus making it easier to get up. This should be used during the day. During the day coughing helps bring mucus up out of your lungs Suppressants (dextromethorphan) just for bedtime so you can sleep   Oral rehydration is important when you have been sweating more than usual and/or having nausea and vomiting. You can use over the counter rehydration supplements like Pedialyte sport, Gatorlyte, Electrolit, Liquid IV, or you can make your own at home. Recipe:  Mix 1 liter of clean or boiled water with 6 teaspoons (2 tablespoons) of sugar and 1/2 tsp of salt. Stir until both dissolve.  You can add sugar free flavoring (i.e. Crystal light) if desired.  Drink small sips frequently rather than large amounts at once to prevent nausea.

## 2023-11-10 NOTE — ED Provider Notes (Signed)
[facility]    CSN: [id number] Arrival date & time: 11/10/23  1356      History   Chief Complaint Chief Complaint  Patient presents with   Cough    Entered by patient    HPI Rebecca Mccarthy is a 43 y.o. female.    Cough Associated symptoms: chills, ear pain, headaches, rhinorrhea and sore throat   Associated symptoms: no chest pain, no fever and no shortness of breath   Respiratory infection 11/02/2023 began to feel better then symptoms worsened 3 days ago now has headache, body aches, cough, fatigue.  Continues with rhinorrhea, nasal congestion and scratchy throat.  Has some chest soreness with coughing.  Denies documented fever, shortness of breath, nausea, vomiting, diarrhea.  Admits work contacts with illness.    History reviewed. No pertinent past medical history.  There are no active problems to display for this patient.   Past Surgical History:  Procedure Laterality Date   CESAREAN SECTION     KNEE SURGERY     LAPAROSCOPIC OVARIAN CYSTECTOMY      OB History     Gravida      Para      Term      Preterm      AB      Living  3      SAB      IAB      Ectopic      Multiple      Live Births               Home Medications    Prior to Admission medications   Medication Sig Start Date End Date Taking? Authorizing Provider  acetaminophen (TYLENOL) 500 MG tablet Take 500 mg by mouth every 6 (six) hours as needed.    [provider]  cetirizine (ZYRTEC) 10 MG tablet Take 1 tablet (10 mg total) by mouth daily. 02/02/23   Carlisle Beers, FNP  DYMISTA [phone] MCG/ACT SUSP Place 1 spray into the nose every 12 (twelve) hours. 02/23/23 03/25/23  Theadora Rama Scales, PA-C  medroxyPROGESTERone (DEPO-PROVERA) 150 MG/ML injection Inject 150 mg into the muscle every 3 (three) months.    [provider]    Family History History reviewed. No pertinent family history.  Social History Social History   Tobacco Use    Smoking status: Never   Smokeless tobacco: Never  Vaping Use   Vaping status: Never Used  Substance Use Topics   Alcohol use: No   Drug use: No     Allergies   Aspirin, Penicillins, and Shrimp [shellfish allergy]   Review of Systems Review of Systems  Constitutional:  Positive for chills and fatigue. Negative for fever.  HENT:  Positive for congestion, ear pain, postnasal drip, rhinorrhea and sore throat. Negative for sinus pressure, sinus pain, trouble swallowing and voice change.   Respiratory:  Positive for cough. Negative for shortness of breath.   Cardiovascular:  Negative for chest pain.  Gastrointestinal:  Negative for abdominal pain, diarrhea, nausea and vomiting.  Neurological:  Positive for headaches.     Physical Exam Triage Vital Signs ED Triage Vitals  Encounter Vitals Group     BP 11/10/23 1425 (!) 130/95     Systolic BP Percentile --      Diastolic BP Percentile --      Pulse Rate 11/10/23 1425 (!) 108     Resp 11/10/23 1425 18     Temp 11/10/23 1425 98.5 F (36.9 C)  Temp src --      SpO2 11/10/23 1425 92 %     Weight --      Height --      Head Circumference --      Peak Flow --      Pain Score 11/10/23 1423 0     Pain Loc --      Pain Education --      Exclude from Growth Chart --    No data found.  Updated Vital Signs BP (!) 130/95   Pulse (!) 108   Temp 98.5 F (36.9 C)   Resp 18   SpO2 92%   Visual Acuity Right Eye Distance:   Left Eye Distance:   Bilateral Distance:    Right Eye Near:   Left Eye Near:    Bilateral Near:     Physical Exam Vitals and nursing note reviewed.  Constitutional:      Appearance: She is not ill-appearing.  HENT:     Head: Normocephalic.     Right Ear: Tympanic membrane and ear canal normal.     Left Ear: Tympanic membrane and ear canal normal.     Nose: Congestion present.     Mouth/Throat:     Mouth: Mucous membranes are moist.     Pharynx: Oropharynx is clear. No oropharyngeal exudate  or posterior oropharyngeal erythema.  Eyes:     General:        Right eye: No discharge.        Left eye: No discharge.     Conjunctiva/sclera: Conjunctivae normal.  Cardiovascular:     Rate and Rhythm: Regular rhythm. Tachycardia present.     Heart sounds: Normal heart sounds.  Pulmonary:     Effort: Pulmonary effort is normal. No respiratory distress.     Breath sounds: Normal breath sounds. No wheezing or rales.  Musculoskeletal:     Cervical back: Neck supple.  Lymphadenopathy:     Cervical: No cervical adenopathy.  Skin:    General: Skin is warm and dry.  Neurological:     Mental Status: She is alert and oriented to person, place, and time.  Psychiatric:        Mood and Affect: Mood normal.      UC Treatments / Results  Labs (all labs ordered are listed, but only abnormal results are displayed) Labs Reviewed  POC COVID19/FLU A&B COMBO    EKG   Radiology No results found.  Procedures Procedures (including critical care time)  Medications Ordered in UC Medications - No data to display  Initial Impression / Assessment and Plan / UC Course  I have reviewed the triage vital signs and the nursing notes.  Pertinent labs & imaging results that were available during my care of the patient were reviewed by me and considered in my medical decision making (see chart for details).     43 year old with headaches body aches fatigue and cough.  Denies chest pain or shortness of breath.  Nontoxic well-hydrated,, mildly tachycardic, mildly hypertensive otherwise vital signs are stable.  No evidence of bacterial infection on exam.  Point-of-care COVID and flu are negative.  OTC meds for symptomatic relief warning signs and follow-up reviewed Final Clinical Impressions(s) / UC Diagnoses   Final diagnoses:  Acute cough   Discharge Instructions   None    ED Prescriptions   None    PDMP not reviewed this encounter.   Rebecca Mccarthy, [place] 11/10/23 1500

## 2024-01-15 ENCOUNTER — Ambulatory Visit
Admission: RE | Admit: 2024-01-15 | Discharge: 2024-01-15 | Disposition: A | Discharge status: Home / Self Care | Source: Ambulatory Visit | Attending: Physician Assistant | Admitting: Physician Assistant

## 2024-01-15 ENCOUNTER — Other Ambulatory Visit: Payer: Self-pay

## 2024-01-15 DIAGNOSIS — M545 Low back pain, unspecified: Secondary | ICD-10-CM | POA: Diagnosis not present

## 2024-01-15 DIAGNOSIS — R519 Headache, unspecified: Secondary | ICD-10-CM

## 2024-01-15 MED ORDER — ACETAMINOPHEN 325 MG PO TABS
650.0000 mg | ORAL_TABLET | Freq: Once | ORAL | Status: AC
  Administered 2024-01-15: 650 mg via ORAL
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
## 2024-01-16 ENCOUNTER — Ambulatory Visit

## 2024-10-08 ENCOUNTER — Telehealth: Admitting: Physician Assistant

## 2024-10-08 DIAGNOSIS — J069 Acute upper respiratory infection, unspecified: Secondary | ICD-10-CM

## 2024-10-08 MED ORDER — PREDNISONE 20 MG PO TABS: 40.0000 mg | ORAL_TABLET | Freq: Every day | ORAL | 0 refills | Status: AC

## 2024-10-08 MED ORDER — PROMETHAZINE-DM 6.25-15 MG/5ML PO SYRP: 5.0000 mL | ORAL_SOLUTION | Freq: Four times a day (QID) | ORAL | 0 refills | Status: AC | PRN

## 2024-10-08 MED ORDER — FLUTICASONE PROPIONATE 50 MCG/ACT NA SUSP: 2.0000 | Freq: Every day | NASAL | 0 refills | Status: AC

## 2024-10-08 NOTE — Progress Notes (Signed)
 " Virtual Visit Consent   Rebecca Mccarthy, you are scheduled for a virtual visit with a [facility] provider today. Just as with appointments in the office, your consent must be obtained to participate. Your consent will be active for this visit and any virtual visit you may have with one of our providers in the next 365 days. If you have a MyChart account, a copy of this consent can be sent to you electronically.  As this is a virtual visit, video technology does not allow for your provider to perform a traditional examination. This may limit your provider's ability to fully assess your condition. If your provider identifies any concerns that need to be evaluated in person or the need to arrange testing (such as labs, EKG, etc.), we will make arrangements to do so. Although advances in technology are sophisticated, we cannot ensure that it will always work on either your end or our end. If the connection with a video visit is poor, the visit may have to be switched to a telephone visit. With either a video or telephone visit, we are not always able to ensure that we have a secure connection.  By engaging in this virtual visit, you consent to the provision of healthcare and authorize for your insurance to be billed (if applicable) for the services provided during this visit. Depending on your insurance coverage, you may receive a charge related to this service.  I need to obtain your verbal consent now. Are you willing to proceed with your visit today? Rebecca Mccarthy has provided verbal consent on 10/08/2024 for a virtual visit (video or telephone). Delon CHRISTELLA Dickinson, PA-C  Date: 10/08/2024 9:56 AM   Virtual Visit via Video Note   I, Delon CHRISTELLA Dickinson, connected with  Rebecca Mccarthy  ([id number], August 18, 1981) on 10/08/2024 at  9:45 AM EST by a video-enabled telemedicine application and verified that I am speaking with the correct person using two identifiers.  Location: Patient: Virtual Visit  Location Patient: Home Provider: Virtual Visit Location Provider: Home Office   I discussed the limitations of evaluation and management by telemedicine and the availability of in person appointments. The patient expressed understanding and agreed to proceed.    History of Present Illness: Rebecca Mccarthy is a 44 y.o. who identifies as a female who was assigned female at birth, and is being seen today for URI symptoms.  HPI: URI  This is a new problem. The current episode started in the past 7 days (2 days). The problem has been gradually worsening. There has been no fever. Associated symptoms include chest pain (only with coughing), congestion, coughing (dry), headaches (waxing and waning), a plugged ear sensation, rhinorrhea and a sore throat (dry and scratchy causing soreness). Pertinent negatives include no diarrhea, ear pain, nausea, sinus pain, vomiting or wheezing. Associated symptoms comments: Hoarse voice, fatigue. Treatments tried: tylenol  sinus. The treatment provided no relief.     Problems: There are no active problems to display for this patient.   Allergies: Allergies[1] Medications: Current Medications[2]  Observations/Objective: Patient is well-developed, well-nourished in no acute distress.  Resting comfortably at home.  Head is normocephalic, atraumatic.  No labored breathing.  Speech is clear and coherent with logical content.  Patient is alert and oriented at baseline.    Assessment and Plan: 1. Viral URI with cough (Primary) - fluticasone  (FLONASE) 50 MCG/ACT nasal spray; Place 2 sprays into both nostrils daily.  Dispense: 16 g; Refill: 0 - promethazine-dextromethorphan (PROMETHAZINE-DM) 6.25-15 MG/5ML  syrup; Take 5 mLs by mouth 4 (four) times daily as needed.  Dispense: 118 mL; Refill: 0 - predniSONE  (DELTASONE ) 20 MG tablet; Take 2 tablets (40 mg total) by mouth daily with breakfast.  Dispense: 10 tablet; Refill: 0  - Suspect viral URI - Symptomatic  medications of choice over the counter as needed - Added Fluticasone  nasal spray for nasal congestion and drainage - Added Promethazine DM for cough - Added Prednisone  for inflamamtion - Push fluids - Rest - Seek further evaluation if symptoms change or worsen   Follow Up Instructions: I discussed the assessment and treatment plan with the patient. The patient was provided an opportunity to ask questions and all were answered. The patient agreed with the plan and demonstrated an understanding of the instructions.  A copy of instructions were sent to the patient via MyChart unless otherwise noted below.    The patient was advised to call back or seek an in-person evaluation if the symptoms worsen or if the condition fails to improve as anticipated.    Arrabella Westerman M Karin Griffith, PA-C     [1]  Allergies Allergen Reactions   Aspirin Hives    Facial swelling   Penicillins    Shrimp [Shellfish Allergy] Swelling    Raw shrimp  [2]  Current Outpatient Medications:    acetaminophen  (TYLENOL ) 500 MG tablet, Take 500 mg by mouth every 6 (six) hours as needed., Disp: , Rfl:    cetirizine  (ZYRTEC ) 10 MG tablet, Take 1 tablet (10 mg total) by mouth daily., Disp: 30 tablet, Rfl: 2   fluticasone  (FLONASE) 50 MCG/ACT nasal spray, Place 2 sprays into both nostrils daily., Disp: 16 g, Rfl: 0   medroxyPROGESTERone (DEPO-PROVERA) 150 MG/ML injection, Inject 150 mg into the muscle every 3 (three) months., Disp: , Rfl:    predniSONE  (DELTASONE ) 20 MG tablet, Take 2 tablets (40 mg total) by mouth daily with breakfast., Disp: 10 tablet, Rfl: 0   promethazine-dextromethorphan (PROMETHAZINE-DM) 6.25-15 MG/5ML syrup, Take 5 mLs by mouth 4 (four) times daily as needed., Disp: 118 mL, Rfl: 0  "

## 2024-10-08 NOTE — Patient Instructions (Signed)
 " Rebecca Mccarthy, thank you for joining Delon CHRISTELLA Dickinson, PA-C for today's virtual visit.  While this provider is not your primary care provider (PCP), if your PCP is located in our provider database this encounter information will be shared with them immediately following your visit.   A [facility] MyChart account gives you access to today's visit and all your visits, tests, and labs performed at [facility]  click here if you don't have a [facility] MyChart account or go to [email or web address]  Consent: (Patient) Rebecca Mccarthy provided verbal consent for this virtual visit at the beginning of the encounter.  Current Medications:  Current Outpatient Medications:    acetaminophen  (TYLENOL ) 500 MG tablet, Take 500 mg by mouth every 6 (six) hours as needed., Disp: , Rfl:    cetirizine  (ZYRTEC ) 10 MG tablet, Take 1 tablet (10 mg total) by mouth daily., Disp: 30 tablet, Rfl: 2   fluticasone  (FLONASE) 50 MCG/ACT nasal spray, Place 2 sprays into both nostrils daily., Disp: 16 g, Rfl: 0   medroxyPROGESTERone (DEPO-PROVERA) 150 MG/ML injection, Inject 150 mg into the muscle every 3 (three) months., Disp: , Rfl:    predniSONE  (DELTASONE ) 20 MG tablet, Take 2 tablets (40 mg total) by mouth daily with breakfast., Disp: 10 tablet, Rfl: 0   promethazine-dextromethorphan (PROMETHAZINE-DM) 6.25-15 MG/5ML syrup, Take 5 mLs by mouth 4 (four) times daily as needed., Disp: 118 mL, Rfl: 0   Medications ordered in this encounter:  Meds ordered this encounter  Medications   fluticasone  (FLONASE) 50 MCG/ACT nasal spray    Sig: Place 2 sprays into both nostrils daily.    Dispense:  16 g    Refill:  0    Supervising Provider:   BLAISE ALEENE KIDD [[id number]]   promethazine-dextromethorphan (PROMETHAZINE-DM) 6.25-15 MG/5ML syrup    Sig: Take 5 mLs by mouth 4 (four) times daily as needed.    Dispense:  118 mL    Refill:  0    Supervising Provider:   BLAISE ALEENE KIDD [[id number]]    predniSONE  (DELTASONE ) 20 MG tablet    Sig: Take 2 tablets (40 mg total) by mouth daily with breakfast.    Dispense:  10 tablet    Refill:  0    Supervising Provider:   BLAISE ALEENE KIDD [[id number]]     *If you need refills on other medications prior to your next appointment, please contact your pharmacy*  Follow-Up: Call back or seek an in-person evaluation if the symptoms worsen or if the condition fails to improve as anticipated.  [facility] [phone]  Other Instructions Viral Respiratory Infection A respiratory infection is an illness that affects part of the respiratory system, such as the lungs, nose, or throat. A respiratory infection that is caused by a virus is called a viral respiratory infection. Common types of viral respiratory infections include: A cold. The flu (influenza). A respiratory syncytial virus (RSV) infection. What are the causes? This condition is caused by a virus. The virus may spread through contact with droplets or direct contact with infected people or their mucus or secretions. The virus may spread from person to person (is contagious). What are the signs or symptoms? Symptoms of this condition include: A stuffy or runny nose. A sore throat or cough. Shortness of breath or difficulty breathing. Yellow or green mucus (sputum). Other symptoms may include: A fever. Sweating or chills. Fatigue. Achy muscles. A headache. How is this diagnosed? This condition may be diagnosed based on:  Your symptoms. A physical exam. Testing of secretions from the nose or throat. Chest X-ray. How is this treated? This condition may be treated with medicines, such as: Antiviral medicine. This may shorten the length of time a person has symptoms. Expectorants. These make it easier to cough up mucus. Decongestant nasal sprays. Acetaminophen  or NSAIDs, such as ibuprofen , to relieve fever and pain. Antibiotic medicines are not prescribed for viral  infections.This is because antibiotics are designed to kill bacteria. They do not kill viruses. Follow these instructions at home: Managing pain and congestion Take over-the-counter and prescription medicines only as told by your health care provider. If you have a sore throat, gargle with a mixture of salt and water 3-4 times a day or as needed. To make salt water, completely dissolve -1 tsp (3-6 g) of salt in 1 cup (237 mL) of warm water. Use nose drops made from salt water to ease congestion and soften raw skin around your nose. Take 2 tsp (10 mL) of honey at bedtime to lessen coughing at night. Do not give honey to children who are younger than 1 year. Drink enough fluid to keep your urine pale yellow. This helps prevent dehydration and helps loosen up mucus. General instructions  Rest as much as possible. Do not drink alcohol. Do not use any products that contain nicotine or tobacco. These products include cigarettes, chewing tobacco, and vaping devices, such as e-cigarettes. If you need help quitting, ask your health care provider. Keep all follow-up visits. This is important. How is this prevented?     Get an annual flu shot. You may get the flu shot in late summer, fall, or winter. Ask your health care provider when you should get your flu shot. Avoid spreading your infection to other people. If you are sick: Wash your hands with soap and water often, especially after you cough or sneeze. Wash for at least 20 seconds. If soap and water are not available, use alcohol-based hand sanitizer. Cover your mouth when you cough. Cover your nose and mouth when you sneeze. Do not share cups or eating utensils. Clean commonly used objects often. Clean commonly touched surfaces. Stay home from work or school as told by your health care provider. Avoid contact with people who are sick during cold and flu season. This is generally fall and winter. Contact a health care provider if: Your  symptoms last for 10 days or longer. Your symptoms get worse over time. You have severe sinus pain in your face or forehead. The glands in your jaw or neck become very swollen. You have shortness of breath. Get help right away if you: Feel pain or pressure in your chest. Have trouble breathing. Faint or feel like you will faint. Have severe and persistent vomiting. Feel confused or disoriented. These symptoms may represent a serious problem that is an emergency. Do not wait to see if the symptoms will go away. Get medical help right away. Call your local emergency services (911 in the [place] Do not drive yourself to the hospital. Summary A respiratory infection is an illness that affects part of the respiratory system, such as the lungs, nose, or throat. A respiratory infection that is caused by a virus is called a viral respiratory infection. Common types of viral respiratory infections include a cold, influenza, and respiratory syncytial virus (RSV) infection. Symptoms of this condition include a stuffy or runny nose, cough, fatigue, achy muscles, sore throat, and fevers or chills. Antibiotic medicines are not prescribed for  viral infections. This is because antibiotics are designed to kill bacteria. They are not effective against viruses. This information is not intended to replace advice given to you by your health care provider. Make sure you discuss any questions you have with your health care provider. Document Revised: 12/28/2020 Document Reviewed: 12/28/2020 Elsevier Patient Education  2024 [employer]   If you have been instructed to have an in-person evaluation today at a local Urgent Care facility, please use the link below. It will take you to a list of all of our available [facility], including address, phone number and hours of operation. Please do not delay care.  [facility]  If you or a family member do not have a primary care provider, use the  link below to schedule a visit and establish care. When you choose a [facility] primary care physician or advanced practice provider, you gain a long-term partner in health. Find a Primary Care Provider  Learn more about [facility]'s in-office and virtual care options:  - Get Care Now "
